# Patient Record
Sex: Male | Born: 1935 | Race: White | Hispanic: No | Marital: Married | Smoking: Former smoker
Health system: Southern US, Community
[De-identification: ages and names within clinical notes are randomized; demographics above are authoritative.]

## PROBLEM LIST (undated history)

## (undated) DIAGNOSIS — I499 Cardiac arrhythmia, unspecified: Secondary | ICD-10-CM

## (undated) DIAGNOSIS — F329 Major depressive disorder, single episode, unspecified: Secondary | ICD-10-CM

## (undated) DIAGNOSIS — E039 Hypothyroidism, unspecified: Secondary | ICD-10-CM

## (undated) DIAGNOSIS — F32A Depression, unspecified: Secondary | ICD-10-CM

## (undated) DIAGNOSIS — M1990 Unspecified osteoarthritis, unspecified site: Secondary | ICD-10-CM

## (undated) DIAGNOSIS — G4730 Sleep apnea, unspecified: Secondary | ICD-10-CM

## (undated) DIAGNOSIS — F419 Anxiety disorder, unspecified: Secondary | ICD-10-CM

## (undated) DIAGNOSIS — R569 Unspecified convulsions: Secondary | ICD-10-CM

## (undated) DIAGNOSIS — N390 Urinary tract infection, site not specified: Secondary | ICD-10-CM

## (undated) DIAGNOSIS — I509 Heart failure, unspecified: Secondary | ICD-10-CM

## (undated) DIAGNOSIS — I2510 Atherosclerotic heart disease of native coronary artery without angina pectoris: Secondary | ICD-10-CM

## (undated) DIAGNOSIS — A499 Bacterial infection, unspecified: Secondary | ICD-10-CM

## (undated) HISTORY — PX: BACK SURGERY: SHX140

## (undated) HISTORY — DX: Urinary tract infection, site not specified: N39.0

## (undated) HISTORY — DX: Cardiac arrhythmia, unspecified: I49.9

## (undated) HISTORY — DX: Major depressive disorder, single episode, unspecified: F32.9

## (undated) HISTORY — DX: Bacterial infection, unspecified: A49.9

## (undated) HISTORY — DX: Depression, unspecified: F32.A

## (undated) HISTORY — DX: Atherosclerotic heart disease of native coronary artery without angina pectoris: I25.10

## (undated) HISTORY — DX: Hypothyroidism, unspecified: E03.9

## (undated) HISTORY — DX: Sleep apnea, unspecified: G47.30

## (undated) HISTORY — DX: Unspecified convulsions: R56.9

## (undated) HISTORY — DX: Anxiety disorder, unspecified: F41.9

## (undated) HISTORY — DX: Unspecified osteoarthritis, unspecified site: M19.90

## (undated) HISTORY — DX: Heart failure, unspecified: I50.9

---

## 1999-04-20 HISTORY — PX: CARDIAC CATHETERIZATION: SHX172

## 1999-04-20 HISTORY — PX: CARDIAC VALVE REPLACEMENT: SHX585

## 1999-05-29 ENCOUNTER — Encounter: Payer: Self-pay | Admitting: Interventional Cardiology

## 1999-05-29 ENCOUNTER — Inpatient Hospital Stay (HOSPITAL_COMMUNITY): Admission: EM | Admit: 1999-05-29 | Discharge: 1999-06-07 | Payer: Self-pay | Admitting: Emergency Medicine

## 1999-05-30 ENCOUNTER — Encounter: Payer: Self-pay | Admitting: Interventional Cardiology

## 1999-06-02 ENCOUNTER — Encounter: Payer: Self-pay | Admitting: Cardiothoracic Surgery

## 1999-06-03 ENCOUNTER — Encounter: Payer: Self-pay | Admitting: Cardiothoracic Surgery

## 1999-06-04 ENCOUNTER — Encounter: Payer: Self-pay | Admitting: Cardiothoracic Surgery

## 1999-07-01 ENCOUNTER — Encounter (HOSPITAL_COMMUNITY): Admission: RE | Admit: 1999-07-01 | Discharge: 1999-09-29 | Payer: Self-pay | Admitting: Interventional Cardiology

## 2002-03-30 ENCOUNTER — Encounter: Admission: RE | Admit: 2002-03-30 | Discharge: 2002-03-30 | Payer: Self-pay | Admitting: Internal Medicine

## 2002-03-30 ENCOUNTER — Encounter: Payer: Self-pay | Admitting: Internal Medicine

## 2002-04-19 HISTORY — PX: JOINT REPLACEMENT: SHX530

## 2002-06-04 ENCOUNTER — Encounter (INDEPENDENT_AMBULATORY_CARE_PROVIDER_SITE_OTHER): Payer: Self-pay

## 2002-06-04 ENCOUNTER — Ambulatory Visit (HOSPITAL_COMMUNITY): Admission: RE | Admit: 2002-06-04 | Discharge: 2002-06-04 | Payer: Self-pay | Admitting: Gastroenterology

## 2003-07-05 ENCOUNTER — Encounter: Admission: RE | Admit: 2003-07-05 | Discharge: 2003-07-05 | Payer: Self-pay | Admitting: Internal Medicine

## 2003-09-27 ENCOUNTER — Encounter: Admission: RE | Admit: 2003-09-27 | Discharge: 2003-09-27 | Payer: Self-pay | Admitting: Neurological Surgery

## 2003-09-30 ENCOUNTER — Encounter: Admission: RE | Admit: 2003-09-30 | Discharge: 2003-09-30 | Payer: Self-pay | Admitting: Neurological Surgery

## 2003-10-18 ENCOUNTER — Encounter: Admission: RE | Admit: 2003-10-18 | Discharge: 2003-10-18 | Payer: Self-pay | Admitting: Neurological Surgery

## 2003-11-26 ENCOUNTER — Encounter: Admission: RE | Admit: 2003-11-26 | Discharge: 2003-11-26 | Payer: Self-pay | Admitting: Neurological Surgery

## 2003-12-12 ENCOUNTER — Ambulatory Visit (HOSPITAL_COMMUNITY): Admission: RE | Admit: 2003-12-12 | Discharge: 2003-12-13 | Payer: Self-pay | Admitting: Neurological Surgery

## 2004-09-03 ENCOUNTER — Encounter: Admission: RE | Admit: 2004-09-03 | Discharge: 2004-09-03 | Payer: Self-pay | Admitting: Neurological Surgery

## 2004-09-09 ENCOUNTER — Ambulatory Visit (HOSPITAL_COMMUNITY): Admission: RE | Admit: 2004-09-09 | Discharge: 2004-09-10 | Payer: Self-pay | Admitting: Neurological Surgery

## 2004-09-11 ENCOUNTER — Emergency Department (HOSPITAL_COMMUNITY): Admission: EM | Admit: 2004-09-11 | Discharge: 2004-09-11 | Payer: Self-pay | Admitting: Emergency Medicine

## 2004-09-21 ENCOUNTER — Ambulatory Visit (HOSPITAL_COMMUNITY): Admission: RE | Admit: 2004-09-21 | Discharge: 2004-09-22 | Payer: Self-pay | Admitting: Neurological Surgery

## 2004-12-23 ENCOUNTER — Inpatient Hospital Stay (HOSPITAL_COMMUNITY): Admission: RE | Admit: 2004-12-23 | Discharge: 2004-12-25 | Payer: Self-pay | Admitting: Neurological Surgery

## 2005-01-25 ENCOUNTER — Encounter: Admission: RE | Admit: 2005-01-25 | Discharge: 2005-01-25 | Payer: Self-pay | Admitting: Neurological Surgery

## 2005-10-04 ENCOUNTER — Encounter: Admission: RE | Admit: 2005-10-04 | Discharge: 2005-10-04 | Payer: Self-pay | Admitting: Neurological Surgery

## 2006-02-02 ENCOUNTER — Inpatient Hospital Stay (HOSPITAL_COMMUNITY): Admission: RE | Admit: 2006-02-02 | Discharge: 2006-02-05 | Payer: Self-pay | Admitting: Orthopedic Surgery

## 2006-03-02 ENCOUNTER — Encounter: Admission: RE | Admit: 2006-03-02 | Discharge: 2006-03-29 | Payer: Self-pay | Admitting: Orthopedic Surgery

## 2006-03-15 ENCOUNTER — Ambulatory Visit (HOSPITAL_COMMUNITY): Admission: RE | Admit: 2006-03-15 | Discharge: 2006-03-16 | Payer: Self-pay | Admitting: Orthopedic Surgery

## 2006-03-30 ENCOUNTER — Encounter: Admission: RE | Admit: 2006-03-30 | Discharge: 2006-04-18 | Payer: Self-pay | Admitting: Orthopedic Surgery

## 2006-04-19 ENCOUNTER — Encounter: Admission: RE | Admit: 2006-04-19 | Discharge: 2006-05-25 | Payer: Self-pay | Admitting: Orthopedic Surgery

## 2006-06-27 ENCOUNTER — Encounter: Admission: RE | Admit: 2006-06-27 | Discharge: 2006-06-27 | Payer: Self-pay | Admitting: Internal Medicine

## 2006-06-28 ENCOUNTER — Inpatient Hospital Stay (HOSPITAL_COMMUNITY): Admission: RE | Admit: 2006-06-28 | Discharge: 2006-07-01 | Payer: Self-pay | Admitting: Urology

## 2006-12-12 ENCOUNTER — Encounter: Admission: RE | Admit: 2006-12-12 | Discharge: 2006-12-12 | Payer: Self-pay | Admitting: Anesthesiology

## 2007-03-31 ENCOUNTER — Encounter: Admission: RE | Admit: 2007-03-31 | Discharge: 2007-03-31 | Payer: Self-pay | Admitting: Neurological Surgery

## 2007-05-12 ENCOUNTER — Encounter: Admission: RE | Admit: 2007-05-12 | Discharge: 2007-05-12 | Payer: Self-pay | Admitting: Neurological Surgery

## 2007-05-15 ENCOUNTER — Encounter: Admission: RE | Admit: 2007-05-15 | Discharge: 2007-05-15 | Payer: Self-pay | Admitting: Neurological Surgery

## 2007-06-07 ENCOUNTER — Inpatient Hospital Stay (HOSPITAL_COMMUNITY): Admission: RE | Admit: 2007-06-07 | Discharge: 2007-06-09 | Payer: Self-pay | Admitting: Neurological Surgery

## 2007-08-15 ENCOUNTER — Encounter: Admission: RE | Admit: 2007-08-15 | Discharge: 2007-08-15 | Payer: Self-pay | Admitting: Neurological Surgery

## 2007-12-11 ENCOUNTER — Encounter: Admission: RE | Admit: 2007-12-11 | Discharge: 2007-12-11 | Payer: Self-pay | Admitting: Neurological Surgery

## 2008-03-16 ENCOUNTER — Emergency Department (HOSPITAL_COMMUNITY): Admission: EM | Admit: 2008-03-16 | Discharge: 2008-03-16 | Payer: Self-pay | Admitting: Emergency Medicine

## 2008-03-21 ENCOUNTER — Encounter: Admission: RE | Admit: 2008-03-21 | Discharge: 2008-03-21 | Payer: Self-pay | Admitting: Internal Medicine

## 2008-03-26 ENCOUNTER — Encounter: Admission: RE | Admit: 2008-03-26 | Discharge: 2008-03-26 | Payer: Self-pay | Admitting: Neurological Surgery

## 2008-04-01 ENCOUNTER — Encounter: Admission: RE | Admit: 2008-04-01 | Discharge: 2008-04-01 | Payer: Self-pay | Admitting: Neurological Surgery

## 2008-04-29 ENCOUNTER — Encounter: Admission: RE | Admit: 2008-04-29 | Discharge: 2008-04-29 | Payer: Self-pay | Admitting: Neurological Surgery

## 2008-05-27 ENCOUNTER — Encounter: Admission: RE | Admit: 2008-05-27 | Discharge: 2008-05-27 | Payer: Self-pay | Admitting: Neurological Surgery

## 2008-06-07 ENCOUNTER — Emergency Department (HOSPITAL_COMMUNITY): Admission: EM | Admit: 2008-06-07 | Discharge: 2008-06-07 | Payer: Self-pay | Admitting: Emergency Medicine

## 2008-06-28 ENCOUNTER — Encounter: Admission: RE | Admit: 2008-06-28 | Discharge: 2008-06-28 | Payer: Self-pay | Admitting: Neurology

## 2009-04-19 DIAGNOSIS — G4730 Sleep apnea, unspecified: Secondary | ICD-10-CM

## 2009-04-19 HISTORY — DX: Sleep apnea, unspecified: G47.30

## 2009-06-16 ENCOUNTER — Encounter: Admission: RE | Admit: 2009-06-16 | Discharge: 2009-06-16 | Payer: Self-pay | Admitting: Neurological Surgery

## 2009-06-24 ENCOUNTER — Inpatient Hospital Stay (HOSPITAL_COMMUNITY): Admission: EM | Admit: 2009-06-24 | Discharge: 2009-06-27 | Payer: Self-pay | Admitting: Emergency Medicine

## 2009-07-07 ENCOUNTER — Encounter: Admission: RE | Admit: 2009-07-07 | Discharge: 2009-07-07 | Payer: Self-pay | Admitting: Neurological Surgery

## 2009-08-18 ENCOUNTER — Encounter: Admission: RE | Admit: 2009-08-18 | Discharge: 2009-08-18 | Payer: Self-pay | Admitting: Neurological Surgery

## 2009-11-24 ENCOUNTER — Encounter: Admission: RE | Admit: 2009-11-24 | Discharge: 2009-11-24 | Payer: Self-pay | Admitting: Neurological Surgery

## 2010-02-23 ENCOUNTER — Encounter: Admission: RE | Admit: 2010-02-23 | Discharge: 2010-02-23 | Payer: Self-pay | Admitting: Neurological Surgery

## 2010-05-10 ENCOUNTER — Encounter: Payer: Self-pay | Admitting: Neurological Surgery

## 2010-05-10 ENCOUNTER — Encounter: Payer: Self-pay | Admitting: Internal Medicine

## 2010-06-08 ENCOUNTER — Other Ambulatory Visit: Payer: Self-pay | Admitting: Orthopedic Surgery

## 2010-06-08 ENCOUNTER — Encounter (HOSPITAL_COMMUNITY): Payer: Medicare Other | Attending: Orthopedic Surgery

## 2010-06-08 DIAGNOSIS — Z01812 Encounter for preprocedural laboratory examination: Secondary | ICD-10-CM | POA: Insufficient documentation

## 2010-06-08 LAB — URINALYSIS, ROUTINE W REFLEX MICROSCOPIC
Bilirubin Urine: NEGATIVE
Ketones, ur: NEGATIVE mg/dL
Nitrite: NEGATIVE
Protein, ur: NEGATIVE mg/dL
Urine Glucose, Fasting: NEGATIVE mg/dL

## 2010-06-08 LAB — CBC
MCHC: 33.8 g/dL (ref 30.0–36.0)
MCV: 93.1 fL (ref 78.0–100.0)
Platelets: 182 10*3/uL (ref 150–400)
RDW: 13.7 % (ref 11.5–15.5)
WBC: 7.6 10*3/uL (ref 4.0–10.5)

## 2010-06-08 LAB — DIFFERENTIAL
Basophils Absolute: 0 10*3/uL (ref 0.0–0.1)
Basophils Relative: 0 % (ref 0–1)
Eosinophils Absolute: 0.4 10*3/uL (ref 0.0–0.7)
Eosinophils Absolute: 0.4 10*3/uL (ref 0.0–0.7)
Eosinophils Relative: 5 % (ref 0–5)
Eosinophils Relative: 5 % (ref 0–5)
Lymphocytes Relative: 25 % (ref 12–46)
Lymphs Abs: 1.9 10*3/uL (ref 0.7–4.0)
Monocytes Absolute: 0.5 10*3/uL (ref 0.1–1.0)
Monocytes Absolute: 0.5 10*3/uL (ref 0.1–1.0)

## 2010-06-08 LAB — BASIC METABOLIC PANEL
BUN: 24 mg/dL — ABNORMAL HIGH (ref 6–23)
CO2: 29 mEq/L (ref 19–32)
Calcium: 8.9 mg/dL (ref 8.4–10.5)
Chloride: 105 mEq/L (ref 96–112)
Creatinine, Ser: 1.39 mg/dL (ref 0.4–1.5)
GFR calc Af Amer: 59 mL/min — ABNORMAL LOW (ref >60)
Glucose, Bld: 96 mg/dL (ref 70–99)

## 2010-06-08 LAB — PROTIME-INR: Prothrombin Time: 15.6 seconds — ABNORMAL HIGH (ref 11.6–15.2)

## 2010-06-09 ENCOUNTER — Inpatient Hospital Stay (HOSPITAL_COMMUNITY)
Admission: RE | Admit: 2010-06-09 | Discharge: 2010-06-17 | DRG: 469 | Disposition: A | Discharge status: Home Health | Payer: Medicare Other | Source: Ambulatory Visit | Attending: Orthopedic Surgery | Admitting: Orthopedic Surgery

## 2010-06-09 DIAGNOSIS — N400 Enlarged prostate without lower urinary tract symptoms: Secondary | ICD-10-CM | POA: Diagnosis present

## 2010-06-09 DIAGNOSIS — Z954 Presence of other heart-valve replacement: Secondary | ICD-10-CM

## 2010-06-09 DIAGNOSIS — I4891 Unspecified atrial fibrillation: Secondary | ICD-10-CM | POA: Diagnosis present

## 2010-06-09 DIAGNOSIS — M1710 Unilateral primary osteoarthritis, unspecified knee: Principal | ICD-10-CM | POA: Diagnosis present

## 2010-06-09 DIAGNOSIS — F411 Generalized anxiety disorder: Secondary | ICD-10-CM | POA: Diagnosis present

## 2010-06-09 DIAGNOSIS — I5033 Acute on chronic diastolic (congestive) heart failure: Secondary | ICD-10-CM | POA: Diagnosis not present

## 2010-06-09 DIAGNOSIS — N390 Urinary tract infection, site not specified: Secondary | ICD-10-CM | POA: Diagnosis not present

## 2010-06-09 DIAGNOSIS — I509 Heart failure, unspecified: Secondary | ICD-10-CM | POA: Diagnosis not present

## 2010-06-09 DIAGNOSIS — G40909 Epilepsy, unspecified, not intractable, without status epilepticus: Secondary | ICD-10-CM | POA: Diagnosis present

## 2010-06-09 DIAGNOSIS — E785 Hyperlipidemia, unspecified: Secondary | ICD-10-CM | POA: Diagnosis present

## 2010-06-09 LAB — TYPE AND SCREEN

## 2010-06-10 LAB — CBC
HCT: 38 % — ABNORMAL LOW (ref 39.0–52.0)
MCV: 94.5 fL (ref 78.0–100.0)
RBC: 4.02 MIL/uL — ABNORMAL LOW (ref 4.22–5.81)
RDW: 13.9 % (ref 11.5–15.5)
WBC: 11.4 10*3/uL — ABNORMAL HIGH (ref 4.0–10.5)

## 2010-06-10 LAB — BASIC METABOLIC PANEL
BUN: 17 mg/dL (ref 6–23)
Chloride: 107 mEq/L (ref 96–112)
GFR calc non Af Amer: 52 mL/min — ABNORMAL LOW (ref >60)
Glucose, Bld: 97 mg/dL (ref 70–99)
Potassium: 4.2 mEq/L (ref 3.5–5.1)
Sodium: 139 mEq/L (ref 135–145)

## 2010-06-10 LAB — URINALYSIS, MICROSCOPIC ONLY
Bilirubin Urine: NEGATIVE
Ketones, ur: NEGATIVE mg/dL
Nitrite: NEGATIVE
Specific Gravity, Urine: 1.008 (ref 1.005–1.030)
Urine Glucose, Fasting: NEGATIVE mg/dL
pH: 5.5 (ref 5.0–8.0)

## 2010-06-10 NOTE — Consult Note (Signed)
NAMEVERN, GUERETTE                 ACCOUNT NO.:  [id number]  MEDICAL RECORD NO.:  [id number]           PATIENT TYPE:  I  LOCATION:  1404                         FACILITY:  [facility]  PHYSICIAN:  Jake Bathe, MD      DATE OF BIRTH:  April 07, 1928  DATE OF CONSULTATION:  06/10/2010 DATE OF DISCHARGE:                                CONSULTATION   CARDIOLOGIST:  Dr. Verdis Prime.  PRIMARY PHYSICIAN:  Dr. Theressa Millard.  REASON FOR CONSULTATION:  Evaluation of atrial fibrillation and anticoagulation in the perioperative course.  HISTORY OF PRESENT ILLNESS:  Mr. Coopersmith is an 77 year old patient of Dr. Katrinka Blazing with a right total knee replacement performed by Dr. Charlann Boxer with chronic atrial fibrillation who prior to surgery was placed on amiodarone 200 mg twice a day as well as metoprolol 50 mg once a day to improve his persistent atrial fibrillation and heart rate.  Surgery went fine, he tolerated the procedure very well and currently on telemetry his heart rates have been in the 80s to 90s mostly, occasionally bumping into the low 100s.  He is asymptomatic from a cardiovascular perspective and doing quite well postoperatively.  Anticoagulation will be needed in postoperative care and this is one of the reasons for this consultation.  He has had intolerances to Coumadin in the past.  PAST MEDICAL HISTORY: 1. Bioprosthetic aortic valve in 2001 for aortic stenosis. 2. Paroxysmal atrial fibrillation, suppressed with amiodarone therapy. 3. BPH. 4. Hyperlipidemia. 5. Spinal stenosis. 6. History of seizure disorder. 7. History of nephrolithiasis. 8. History of hemarthrosis, on Coumadin.  ALLERGIES:  COUMADIN, OXYCODONE and PENICILLIN.  CURRENT MEDICATIONS: 1. Niacin 5 mg twice a day. 2. Metoprolol XL succinate 50 mg every morning. 3. Amiodarone 200 mg twice a day. 4. Finasteride 5 mg every morning. 5. He is also on Dilantin.  Other medications reviewed.  Please see med reconciliation for  full details.  PAST SURGICAL HISTORY:  Prior total knee replacement in 2007, lumbar fusion, and lumbar laminectomy.  SOCIAL HISTORY:  Former smoker, quit in 1985.  Drinks 1 or 2 alcoholic drinks per week.  He is retired, sometimes works at [employer].  REVIEW OF SYSTEMS:  Having knee pain currently postoperatively.  Denies any syncope, chest pain, shortness of breath, bleeding.  Unless specified above, all other 12 review of systems negative.  PHYSICAL EXAMINATION:  GENERAL:  Currently in discomfort from his knee but in no acute distress, alert and oriented x3. VITAL SIGNS:  Temperature 98.1, pulse 77, respirations 16, blood pressure 136/787, sating 92% on 2 L HEENT:  Eyes, well-perfused conjunctivae.  EOMI.  No scleral icterus. CARDIOVASCULAR:  Irregularly irregular rhythm with regular rate.  A 2/6 crescendo-decrescendo murmur at right upper sternal border. LUNGS:  Clear to auscultation bilaterally.  No wheezes.  No rales. Normal respiratory effort. ABDOMEN:  Soft, nontender, normoactive bowel sounds.  No rebound, no guarding. GU:  Deferred. RECTAL:  Deferred. NEURO:  Nonfocal.  No tremors are noted. PSYCH:  Normal affect but in pain.  LABORATORY DATA:  Telemetry reviewed as above, atrial fibrillation, persistent but fairly well rate controlled.  Sodium 139, potassium 4.2, BUN 17, creatinine 1.32.  GFR 52.  White count 11.4, hemoglobin 12.6, hematocrit 38, platelet count 186. Telemetry reviewed as above.  Prior EKG reviewed.  ASSESSMENT/PLAN:  An 77 year old male status post total knee replacement with paroxysmal atrial fibrillation, currently in atrial fibrillation with need of chronic anticoagulation.  Atrial fibrillation - currently well rate controlled on amiodarone 200 twice a day as well as metoprolol 50 mg once a day.  ASSESSMENT AND PLAN: 1. His atrial fibrillation is currently well rate controlled on     current medications as described above.  He  will require full     anticoagulation and has had issues with Coumadin in the past as     described above.  In the chart already has been notated that he     will be starting Pradaxa as an outpatient.  However, Xarelto or     rivaroxaban which is factor X inhibitor has already been started     for DVT prophylaxis in the setting of his knee replacement at 10 mg     once a day.  My suggestion is to increase the Xarelto or     rivaroxaban to 20 mg once a day for full anticoagulation.  This     would be given in lieu of Pradaxa twice a day.  This would help     protect him against stroke in the setting of atrial fibrillation.     Please increase rivaroxaban or Xarelto to 20 mg once a day and give     to him as outpatient.  Creatinine clearance should be able to     tolerate this dosing regimen.  Continue with amiodarone as well as     metoprolol. 2. Right knee pain - per primary team status post knee replacement. 3. Chronic anticoagulation as above. 4. Hyperlipidemia.  Continue with niacin. 5. Aortic valve replacement, bioprosthetic - no changes to current     regimen.  I will notify Dr. Verdis Prime.     Jake Bathe, MD     MCS/MEDQ  D:  06/10/2010  T:  06/10/2010  Job:  [id number]  Electronically Signed by Donato Schultz MD on 06/10/2010 24:40:10 PM

## 2010-06-11 LAB — BASIC METABOLIC PANEL
Calcium: 7.9 mg/dL — ABNORMAL LOW (ref 8.4–10.5)
GFR calc Af Amer: 47 mL/min — ABNORMAL LOW (ref >60)
GFR calc non Af Amer: 39 mL/min — ABNORMAL LOW (ref >60)
Potassium: 4.7 mEq/L (ref 3.5–5.1)
Sodium: 135 mEq/L (ref 135–145)

## 2010-06-11 LAB — CBC
MCHC: 33 g/dL (ref 30.0–36.0)
RDW: 13.4 % (ref 11.5–15.5)
WBC: 15.3 10*3/uL — ABNORMAL HIGH (ref 4.0–10.5)

## 2010-06-12 LAB — URINE CULTURE
Colony Count: NO GROWTH
Culture  Setup Time: 201202230514
Special Requests: POSITIVE

## 2010-06-12 LAB — BASIC METABOLIC PANEL
BUN: 17 mg/dL (ref 6–23)
CO2: 23 mEq/L (ref 19–32)
Chloride: 108 mEq/L (ref 96–112)
Creatinine, Ser: 1.98 mg/dL — ABNORMAL HIGH (ref 0.4–1.5)
Glucose, Bld: 146 mg/dL — ABNORMAL HIGH (ref 70–99)

## 2010-06-12 NOTE — Op Note (Signed)
NAMETOUSSAINT, [name]                 ACCOUNT NO.:  [id number]  MEDICAL RECORD NO.:  [id number]           PATIENT TYPE:  I  LOCATION:  1404                         FACILITY:  [facility]  PHYSICIAN:  Madlyn Frankel. Charlann Boxer, M.D.  DATE OF BIRTH:  [birth year]  DATE OF PROCEDURE:  06/09/2010 DATE OF DISCHARGE:                              OPERATIVE REPORT   PREOPERATIVE DIAGNOSIS:  Right knee osteoarthritis.  POSTOPERATIVE DIAGNOSIS:  Right knee osteoarthritis.  PROCEDURE:  Right total knee replacement.  COMPONENTS USED:  DePuy rotating platform posterior stabilized knee system with size 4 narrow femur, 4 tibia, 12.5 insert and 41 patellar button.  SURGEON:  Madlyn Frankel. Charlann Boxer, M.D.  ASSISTANT:  Nelia Shi. Webb Silversmith, RN  ANESTHESIA:  Preoperative regional femoral nerve block plus general anesthetic.  SPECIMENS:  None.  COMPLICATIONS:  None.  DRAINS:  One Hemovac.  TOURNIQUET TIME:  33 minutes at 250 mmHg.  BLOOD LOSS:  About 100 cc.  INDICATIONS FOR PROCEDURE:  [name] is an 77 year old gentleman with a history of bilateral knee arthritis.  He has had a history of left total knee replacement he had done well with.  He was referred for surgical consultation.  He had an advanced right knee osteoarthritis, was failing respond to conservative measures and he wished to proceed with arthroplasty.  Risks and benefits reviewed from his previous course.  He was ready to proceed.  Consent was obtained for the benefit of pain relief.  PROCEDURE IN DETAIL:  The patient was brought to operative theater. Once adequate anesthesia was established and preoperative antibiotics administered, Ancef, he was positioned supine with the right thigh tourniquet placed.  The right lower extremity was then prepped and draped in sterile fashion with the right leg placed in the Mayo leg holder.  Time-out was performed identifying the patient, planned procedure and extremity.  Leg was exsanguinated.  Tourniquet  elevated to 250 mmHg.  Midline incision was made followed by median parapatellar arthrotomy.  Following initial exposure, attention was first directed to patella.  Precut measurement was noted to be about 25 mm.  I resected down to about 14 mm and used a 41 patellar button to cover the cut surface as well as to restore height.  Lug holes were drilled and a metal shim was placed to protect the patella from retractors and saw blades.  Attention was now directed to the femur.  The femoral canal was opened with a drill, irrigated to try to prevent fat emboli.  An intramedullary rod was passed and at 5 degrees of valgus, 10 mm of bone resected off the distal femur.  The tibia was then subluxated anteriorly.  The extramedullary guide was utilized and lined up parallel to the tibial crest.  A proximal tibial cut was made based off the lateral tibial plateau at 10 mm.  Extension gap was confirmed to be stable with medial and lateral collateral ligaments.  I then confirmed that the cut was perpendicular to the coronal plane. Once this was done, I sized the femur to be a size 4, chose to use a 4 narrow component based on the medial  and lateral dimension.  The size 4 rotation block was then pinned into position, anterior reference based off the proximal C-clamp.  The 4 in 1 cutting block was pinned in position.  Anterior, posterior and chamfer cuts were then made without difficulty or notching.  Final box cut was made off the lateral aspect of distal femur.  Tibia was then subluxated again, size 4 tibial tray fit best.  It was pinned into position, drilled and keel punched.  Trial reduction was carried out with a 4 femur, 4 tibia and initially 12-mm insert.  The knee came to full extension with stable medial and lateral collateral ligaments.  The patella tracked through the trochlea without application of pressure.  The trial components were then removed.  The final components were opened  including the polyethylene insert.  Once I irrigated the knee with normal saline solution and pulse lavaged, cement was mixed and the final component then cemented onto clean and dried cut surfaces of bone.  At this point, the extruded cement was removed.  Once the cement had fully cured and excessive cement was removed throughout the knee, the final 12.5 insert to match the 4 femur was chosen.  The medium Hemovac drain was placed deep.  Tourniquet had been let down after 33 minutes.  There was some synovial oozing but nothing significant.  I then reapproximated extensor mechanism using #1 Vicryl with the knee in flexion.  The remaining of the wound was closed with 2- 0 Vicryl and running 4-0 Monocryl.  The knee was cleaned, dried and dressed sterilely utilizing Dermabond and Aquacel dressing.  The drain site was dressed separately.  The knee was then wrapped in an Ace wrap. He was then extubated and brought to recovery room in stable condition tolerating the procedure well.     Madlyn Frankel Charlann Boxer, M.D.     MDO/MEDQ  D:  06/09/2010  T:  06/10/2010  Job:  [id number]  Electronically Signed by Durene Romans M.D. on 06/12/2010 07:05:00 AM

## 2010-06-12 NOTE — H&P (Signed)
[name], [name]                 ACCOUNT NO.:  [id number]  MEDICAL RECORD NO.:  [id number]           PATIENT TYPE:  LOCATION:                                 FACILITY:  PHYSICIAN:  Madlyn Frankel. Charlann Boxer, M.D.  DATE OF BIRTH:  Dec 16, 1927  DATE OF ADMISSION: DATE OF DISCHARGE:                             HISTORY & PHYSICAL   ADMITTING DIAGNOSIS:  Right knee osteoarthritis.  BRIEF HISTORY:  This is a patient seen by Dr. Darrelyn Hillock for sometime for right knee pain.  He has failed conservative treatment, was referred to Dr. Charlann Boxer for consideration of right knee arthroplasty and has decided to proceed with such on June 09, 2010.  PAST MEDICAL HISTORY:  Significant for: 1. Seizures of some type, the last one was March 2011. 2. He has sleep apnea. 3. High cholesterol. 4. Generalized osteoarthritis. 5. He just had some type of skin cancer removed from his right temple. 6. He has frequent urinary tract infections and p.m. frequency. 7. Low back pain with a history of fusions. 8. Generalized fatigue. 9. Balance problems. 10.The patient has also got a history of a heart valve replacement. 11.In 2007, aortic valve replacement.  PAST SURGICAL HISTORY:  Surgical history is that of the aortic replacement in 2007.  He has had 6 back surgeries, a left knee surgery and other small procedures, he does not have listed.  SOCIAL HISTORY:  The patient is married.  He is a retired Music therapist.  He has a past history of tobacco.  No history of alcohol or street drugs abuse.  He has 4 children.  DISPOSITION:  The disposition plan is to go home with home health physical therapy.  MEDICATIONS:  Current medication list is: 1. Dilantin 100 mg 2 times a day. 2. Requip 0.5 every 8 hours. 3. Simvastatin 5 mg daily. 4. Amiodarone 300 mg daily. 5. Hydrocodone, currently he is taking as needed. 6. Alendronate  70 mg daily. 7. Folic acid daily. 8. Flector patch daily. 9. Niacin. 10.Vitamin  C. 11.Calcium. 12.Magnesium. 13.Vitamin D3. 14.Regular aspirin. 15.Colace. 16.Citrucel. 17.Advil as needed. 18.Osteo Bi-Flex daily.  ALLERGIES:  He states that OXYCODONE gives him delusions.  He cannot take PENICILLIN.  REVIEW OF SYSTEMS:  Notable for those difficulties described in the history of present illness, past medical history.  However, his review of system sheet is otherwise unremarkable.  PHYSICAL EXAMINATION:  VITAL SIGNS:  Patient is 5 feet 6 inches.  He is 178 pounds. HEENT:  Shows him to be normocephalic.  He does have tinnitus from time to time with poor balance.  He has generalized fatigue syndrome. NECK:  Unremarkable. CHEST:  Shows clear to auscultation bilaterally.  He has sleep apnea. HEART:  Has S1, S2.  No murmurs, rubs, gallops. ABDOMEN:  Soft, nondistended. GENITOURINARY/GASTROINTESTINAL:  Otherwise unremarkable except for the frequent UTIs and p.m. frequency. EXTREMITIES:  Exam shows osteoarthritis with low back pains. DERMATOLOGICALLY:  Intact.  He is healing, just had staples removed today from his right temple for skin cancer. NEUROLOGICALLY:  Intact.  LABORATORY DATA:  Labs, EKG and chest x-ray are pending through [facility].  IMPRESSION:  Right knee osteoarthritis.  PLAN:  He will be admitted on June 09, 2010, for a total knee arthroplasty with Dr. Charlann Boxer.  His discharge meds including Robaxin, Xarelto, iron, MiraLax, and Colace were given to him today.  His pain medicines will be given to him at discharge.     William L. Webb Silversmith, RN   ______________________________ Madlyn Frankel Charlann Boxer, M.D.    RLW/MEDQ  D:  06/01/2010  T:  06/01/2010  Job:  [id number]  Electronically Signed by Lauree Chandler NP-C on 06/11/2010 01:22:54 PM Electronically Signed by Durene Romans M.D. on 06/12/2010 07:04:55 AM

## 2010-06-13 DIAGNOSIS — I4891 Unspecified atrial fibrillation: Secondary | ICD-10-CM

## 2010-06-13 LAB — BASIC METABOLIC PANEL
Chloride: 110 mEq/L (ref 96–112)
GFR calc non Af Amer: 43 mL/min — ABNORMAL LOW (ref >60)
Glucose, Bld: 141 mg/dL — ABNORMAL HIGH (ref 70–99)
Potassium: 5.3 mEq/L — ABNORMAL HIGH (ref 3.5–5.1)
Sodium: 139 mEq/L (ref 135–145)

## 2010-06-13 LAB — URINALYSIS, ROUTINE W REFLEX MICROSCOPIC
Bilirubin Urine: NEGATIVE
Nitrite: NEGATIVE
Specific Gravity, Urine: 1.019 (ref 1.005–1.030)
Urine Glucose, Fasting: NEGATIVE mg/dL
pH: 5.5 (ref 5.0–8.0)

## 2010-06-13 LAB — HEMOGLOBIN AND HEMATOCRIT, BLOOD: Hemoglobin: 9.9 g/dL — ABNORMAL LOW (ref 13.0–17.0)

## 2010-06-13 LAB — URINE MICROSCOPIC-ADD ON

## 2010-06-14 ENCOUNTER — Inpatient Hospital Stay (HOSPITAL_COMMUNITY): Payer: Medicare Other

## 2010-06-14 DIAGNOSIS — R0602 Shortness of breath: Secondary | ICD-10-CM

## 2010-06-14 LAB — BASIC METABOLIC PANEL
Calcium: 7.6 mg/dL — ABNORMAL LOW (ref 8.4–10.5)
Chloride: 109 mEq/L (ref 96–112)
Creatinine, Ser: 1.27 mg/dL (ref 0.4–1.5)
GFR calc Af Amer: >60 mL/min (ref >60)

## 2010-06-14 LAB — URINE CULTURE
Culture  Setup Time: 201202252023
Special Requests: POSITIVE

## 2010-06-14 LAB — TROPONIN I
Troponin I: 0.04 ng/mL (ref 0.00–0.06)
Troponin I: 0.05 ng/mL (ref 0.00–0.06)

## 2010-06-14 LAB — CK TOTAL AND CKMB (NOT AT ARMC)
CK, MB: 1.8 ng/mL (ref 0.3–4.0)
Relative Index: INVALID (ref 0.0–2.5)
Total CK: 61 U/L (ref 7–232)

## 2010-06-14 LAB — HEMOCCULT GUIAC POC 1CARD (OFFICE): Fecal Occult Bld: NEGATIVE

## 2010-06-14 LAB — BRAIN NATRIURETIC PEPTIDE: Pro B Natriuretic peptide (BNP): 402 pg/mL — ABNORMAL HIGH (ref 0.0–100.0)

## 2010-06-15 LAB — BASIC METABOLIC PANEL
CO2: 23 mEq/L (ref 19–32)
Calcium: 7.7 mg/dL — ABNORMAL LOW (ref 8.4–10.5)
Creatinine, Ser: 1.44 mg/dL (ref 0.4–1.5)
GFR calc non Af Amer: 47 mL/min — ABNORMAL LOW (ref >60)
Glucose, Bld: 128 mg/dL — ABNORMAL HIGH (ref 70–99)
Sodium: 141 mEq/L (ref 135–145)

## 2010-06-16 LAB — BASIC METABOLIC PANEL
BUN: 8 mg/dL (ref 6–23)
CO2: 25 mEq/L (ref 19–32)
Calcium: 7.6 mg/dL — ABNORMAL LOW (ref 8.4–10.5)
Creatinine, Ser: 1.38 mg/dL (ref 0.4–1.5)
GFR calc non Af Amer: 50 mL/min — ABNORMAL LOW (ref >60)
Glucose, Bld: 126 mg/dL — ABNORMAL HIGH (ref 70–99)

## 2010-06-16 LAB — URINALYSIS, ROUTINE W REFLEX MICROSCOPIC
Bilirubin Urine: NEGATIVE
Ketones, ur: NEGATIVE mg/dL
Nitrite: POSITIVE — AB
Protein, ur: 100 mg/dL — AB
Urobilinogen, UA: 0.2 mg/dL (ref 0.0–1.0)

## 2010-06-16 LAB — URINE MICROSCOPIC-ADD ON

## 2010-06-17 LAB — BASIC METABOLIC PANEL
CO2: 26 mEq/L (ref 19–32)
Calcium: 8 mg/dL — ABNORMAL LOW (ref 8.4–10.5)
GFR calc Af Amer: >60 mL/min (ref >60)
Potassium: 3.9 mEq/L (ref 3.5–5.1)
Sodium: 139 mEq/L (ref 135–145)

## 2010-06-18 NOTE — Discharge Summary (Signed)
NAMEBRENDEN, [name]                 ACCOUNT NO.:  [id number]  MEDICAL RECORD NO.:  [id number]           PATIENT TYPE:  I  LOCATION:  1430                         FACILITY:  [facility]  PHYSICIAN:  Madlyn Frankel. Charlann Boxer, M.D.  DATE OF BIRTH:  Apr 13, 1928  DATE OF ADMISSION:  06/09/2010 DATE OF DISCHARGE:  06/17/2010                              DISCHARGE SUMMARY   ADMITTING DIAGNOSIS:  Right knee osteoarthritis.  DISCHARGE DIAGNOSES: 1. Right knee osteoarthritis, status post right total knee replacement     on June 09, 2010. 2. Atrial fibrillation. 3. Congestive heart failure. 4. History of benign prostatic hypertrophy. 5. Hyperlipidemia. 6. Anxiety. 7. History of seizure disorder. 8. History of nephrolithiasis. 9. History of bioprosthetic aortic valve for stenosis. 10.Urinary tract infection.  BRIEF HISTORY:  [name] is an 77 year old gentleman who was seen and evaluated in the office for right knee osteoarthritis with significant reduction in his quality of life.  He had previous left total knee replacement.  Once reviewing issues with his medical physician, he was ready to proceed with this and to try and improve his overall quality of life.  Risks and benefits have been discussed in the office.  Surgical plan was set.  HOSPITAL COURSE:  The patient was admitted for same-day surgery on June 09, 2010.  He underwent a right total knee replacement that was uncomplicated.  He was in atrial fibrillation when he presented to the hospital, currently on amiodarone and metoprolol.  Postoperatively, his recovery room stay was uneventful.  He was transferred to the orthopedic ward.  On postop day #2, he was seen and evaluated by Cardiology through Dr. Garnette Scheuermann, his primary cardiologist.  At the time, there was discussion regarding his anticoagulation.  The ultimate decision was to place him on Pradaxa versus Coumadin which the patient had denied in the past. Based on his current  use of phenytoin, he was not a candidate to receive Xarelto.  His postoperative course from hemodynamic standpoint remained stable. On postop day #2, his labs had stabilized with stable electrolytes and hematocrit remained stable.  His wound remained dry.  His Hemovac drain was discontinued for 24 hours after surgery.  His hospital course at this point was thus complicated by some anxiety issues as well as continued efforts to maintain hemodynamic standpoint from his underlying atrial fibrillation.  He did not have any bouts of chest pain that was significant for an acute myocardial event.  He was transferred to and from the orthopedic ward to monitoring.  There was some concern about shortness of breath but this was felt to be due to fluid overload and perhaps some diastolic failure for which Cardiology was on board and following.  On postop day #7 on June 16, 2010, he underwent a cardioversion due to his persistent atrial fibrillation and his fluid overload and balance issues related perhaps to diastolic failure.  On postprocedure day #8 on June 17, 2010, he remained in sinus rhythm and was seen and evaluated by Dr. Garnette Scheuermann and felt safe to be discharged to home.  While in the hospital, he was seen and evaluated  by Physical Therapy to work on as much motion in his knee as possible due to his limited mobility and anxiety early on.  He was placed on the CPM machine while he was in the hospital.  DISCHARGE INSTRUCTIONS:  The patient is to be discharged to home today with home health physical therapy to maximize his range of motion, strength, and gait.  He may shower immediately as he has a water sealed dressing.  He will need to return to see Dr. Garnette Scheuermann within a week and myself within a week.  He can contact our office at [facility] for that appointment.  He is otherwise to keep his wound as dry as possible.  DISCHARGE  MEDICATIONS: 1. Xanax 0.5 mg every 8 hours as needed for some anxiety. 2. Ciprofloxacin 500 mg p.o. b.i.d. for 5 days. 3. Pradaxa 150 mg p.o. q.12h. 4. Iron sulfate which he received a prescription for earlier 325 mg     b.i.d. 3 times a day for 2 weeks and then off. 5. Norco 7.5/325 one to two tablets every 4-6 hours as needed for     pain. 6. Robaxin 500 mg p.o. q.6h. as needed for muscle spasm pain. 7. MiraLax 17 g daily as needed for constipation. 8. Colace 100 mg p.o. b.i.d. as needed for constipation while on pain     medicines, decrease with abdominal cramping or loose stools. 9. Alendronate 70 mg weekly. 10.Amiodarone 200 mg p.o. b.i.d. 11.Metoprolol 50 mg q.a.m. 12.Calcium over-the-counter. 13.Phenytoin 100 mg 2 capsules b.i.d. 14.Finasteride 5 mg p.o. q.a.m. 15.Flector patches and glucosamine can be used as needed and be     discontinued as his knee arthritis is at this point treated. 16.Magnesium oxide 400 mg over-the-counter daily. 17.Niacin 500 mg 2 tablets b.i.d. 18.Ropinirole 0.5 mg 1 q.8h. for muscle cramps. 19.Vitamin D3 is 1000 units daily. 20.Vitamin C 500 mg daily. 21.Advil PM as needed.  If there are orthopedic questions, that can be addressed through our office, and any cardiology questions, he will of course contact Dr. Erskine Emery Smith's office.     Madlyn Frankel Charlann Boxer, M.D.     MDO/MEDQ  D:  06/17/2010  T:  06/17/2010  Job:  [id number]  Electronically Signed by Durene Romans M.D. on 06/18/2010 10:43:08 AM

## 2010-06-26 NOTE — Consult Note (Signed)
  NAMEWELTON, [name]                 ACCOUNT NO.:  [id number]  MEDICAL RECORD NO.:  [id number]           PATIENT TYPE:  I  LOCATION:  1430                         [facility]ACILITY:  [facility]  PHYSICIAN:  Lyn Records, M.D.   DATE OF BIRTH:  1927-08-16  DATE OF CONSULTATION: DATE OF DISCHARGE:                                CONSULTATION   PROCEDURE PERFORMED:  Elective electrical cardioversion for conversion of atrial tachycardia/atrial flutter/atrial fib in a patient with poorly controlled ventricular response and CHF.  DESCRIPTION:  Dr. Council Mechanic provided anesthesia coverage.  The patient was intubated after being given propofol.  Once asleep, the patient was converted using a single shock of 200 watt-seconds of energy that reverted the atrial tachycardia/atrial fibrillation to normal sinus rhythm.  He awakened from the anesthesia without difficulty.  He was extubated.  No complications occurred.  Postprocedure EKG demonstrated normal sinus rhythm at a rate of 89 beats per minute.  PR interval was 209 seconds.  CONCLUSION:  Successful conversion to normal sinus rhythm.  PLAN:  Continue amiodarone loading and metoprolol for rate control.  We will continue to follow the patient.  Eligible for discharge within the next 24 hours.     Lyn Records, M.D.     HWS/MEDQ  D:  06/16/2010  T:  06/17/2010  Job:  [id number]  cc:   Theressa Millard, M.D. Fax: [phone]  Madlyn Frankel. Charlann Boxer, M.D. Fax: [phone]  Electronically Signed by Verdis Prime M.D. on 06/25/2010 09:28:20 AM

## 2010-07-09 ENCOUNTER — Ambulatory Visit: Payer: Medicare Other | Admitting: Rehabilitation

## 2010-07-09 ENCOUNTER — Ambulatory Visit (HOSPITAL_COMMUNITY)
Admission: RE | Admit: 2010-07-09 | Discharge: 2010-07-09 | Disposition: A | Discharge status: Home / Self Care | Payer: Medicare Other | Source: Ambulatory Visit | Attending: Interventional Cardiology | Admitting: Interventional Cardiology

## 2010-07-09 DIAGNOSIS — Z01810 Encounter for preprocedural cardiovascular examination: Secondary | ICD-10-CM | POA: Insufficient documentation

## 2010-07-09 DIAGNOSIS — I5020 Unspecified systolic (congestive) heart failure: Secondary | ICD-10-CM | POA: Insufficient documentation

## 2010-07-09 DIAGNOSIS — I498 Other specified cardiac arrhythmias: Secondary | ICD-10-CM | POA: Insufficient documentation

## 2010-07-13 LAB — DIFFERENTIAL
Eosinophils Relative: 5 % (ref 0–5)
Lymphocytes Relative: 24 % (ref 12–46)
Lymphs Abs: 2.4 10*3/uL (ref 0.7–4.0)
Monocytes Absolute: 0.7 10*3/uL (ref 0.1–1.0)
Monocytes Relative: 8 % (ref 3–12)

## 2010-07-13 LAB — URINALYSIS, ROUTINE W REFLEX MICROSCOPIC
Glucose, UA: NEGATIVE mg/dL
Leukocytes, UA: NEGATIVE
Protein, ur: NEGATIVE mg/dL
Specific Gravity, Urine: 1.013 (ref 1.005–1.030)
Urobilinogen, UA: 0.2 mg/dL (ref 0.0–1.0)

## 2010-07-13 LAB — URINE CULTURE: Culture: NO GROWTH

## 2010-07-13 LAB — URINE MICROSCOPIC-ADD ON

## 2010-07-13 LAB — CBC
HCT: 49.9 % (ref 39.0–52.0)
Hemoglobin: 17 g/dL (ref 13.0–17.0)
WBC: 9.7 10*3/uL (ref 4.0–10.5)

## 2010-07-13 LAB — BASIC METABOLIC PANEL
GFR calc non Af Amer: 59 mL/min — ABNORMAL LOW (ref >60)
Potassium: 4.2 mEq/L (ref 3.5–5.1)
Sodium: 135 mEq/L (ref 135–145)

## 2010-07-15 ENCOUNTER — Ambulatory Visit: Payer: Medicare Other | Attending: Orthopedic Surgery | Admitting: Rehabilitation

## 2010-07-15 DIAGNOSIS — M25669 Stiffness of unspecified knee, not elsewhere classified: Secondary | ICD-10-CM | POA: Insufficient documentation

## 2010-07-15 DIAGNOSIS — R5381 Other malaise: Secondary | ICD-10-CM | POA: Insufficient documentation

## 2010-07-15 DIAGNOSIS — R262 Difficulty in walking, not elsewhere classified: Secondary | ICD-10-CM | POA: Insufficient documentation

## 2010-07-15 DIAGNOSIS — M25569 Pain in unspecified knee: Secondary | ICD-10-CM | POA: Insufficient documentation

## 2010-07-15 DIAGNOSIS — IMO0001 Reserved for inherently not codable concepts without codable children: Secondary | ICD-10-CM | POA: Insufficient documentation

## 2010-07-21 ENCOUNTER — Ambulatory Visit: Payer: Medicare Other | Attending: Orthopedic Surgery | Admitting: Physical Therapy

## 2010-07-21 ENCOUNTER — Encounter: Payer: Medicare Other | Admitting: Physical Therapy

## 2010-07-21 DIAGNOSIS — M25569 Pain in unspecified knee: Secondary | ICD-10-CM | POA: Insufficient documentation

## 2010-07-21 DIAGNOSIS — R262 Difficulty in walking, not elsewhere classified: Secondary | ICD-10-CM | POA: Insufficient documentation

## 2010-07-21 DIAGNOSIS — M25669 Stiffness of unspecified knee, not elsewhere classified: Secondary | ICD-10-CM | POA: Insufficient documentation

## 2010-07-21 DIAGNOSIS — R5381 Other malaise: Secondary | ICD-10-CM | POA: Insufficient documentation

## 2010-07-21 DIAGNOSIS — IMO0001 Reserved for inherently not codable concepts without codable children: Secondary | ICD-10-CM | POA: Insufficient documentation

## 2010-07-22 ENCOUNTER — Ambulatory Visit: Payer: Medicare Other | Admitting: Rehabilitation

## 2010-07-23 ENCOUNTER — Encounter: Payer: Medicare Other | Admitting: Physical Therapy

## 2010-07-23 ENCOUNTER — Ambulatory Visit: Payer: Medicare Other | Admitting: Physical Therapy

## 2010-07-27 ENCOUNTER — Ambulatory Visit: Payer: Medicare Other | Admitting: Physical Therapy

## 2010-07-29 ENCOUNTER — Ambulatory Visit: Payer: Medicare Other | Admitting: Rehabilitation

## 2010-07-30 ENCOUNTER — Ambulatory Visit: Payer: Medicare Other | Admitting: Rehabilitation

## 2010-08-03 ENCOUNTER — Ambulatory Visit: Payer: Medicare Other | Admitting: Physical Therapy

## 2010-08-04 LAB — DIFFERENTIAL
Eosinophils Absolute: 0.4 10*3/uL (ref 0.0–0.7)
Lymphocytes Relative: 21 % (ref 12–46)
Lymphs Abs: 1.5 10*3/uL (ref 0.7–4.0)
Monocytes Relative: 6 % (ref 3–12)
Neutrophils Relative %: 67 % (ref 43–77)

## 2010-08-04 LAB — URINALYSIS, ROUTINE W REFLEX MICROSCOPIC
Glucose, UA: NEGATIVE mg/dL
Leukocytes, UA: NEGATIVE
Protein, ur: NEGATIVE mg/dL
pH: 6 (ref 5.0–8.0)

## 2010-08-04 LAB — POCT CARDIAC MARKERS
CKMB, poc: 1 ng/mL (ref 1.0–8.0)
CKMB, poc: 2.8 ng/mL (ref 1.0–8.0)
Myoglobin, poc: 237 ng/mL (ref 12–200)
Troponin i, poc: <0.05 ng/mL (ref 0.00–0.09)

## 2010-08-04 LAB — COMPREHENSIVE METABOLIC PANEL
ALT: 12 U/L (ref 0–53)
Calcium: 9.2 mg/dL (ref 8.4–10.5)
Creatinine, Ser: 1.17 mg/dL (ref 0.4–1.5)
GFR calc Af Amer: >60 mL/min (ref >60)
GFR calc non Af Amer: >60 mL/min (ref >60)
Glucose, Bld: 96 mg/dL (ref 70–99)
Sodium: 140 mEq/L (ref 135–145)
Total Protein: 6.5 g/dL (ref 6.0–8.3)

## 2010-08-04 LAB — RAPID URINE DRUG SCREEN, HOSP PERFORMED
Amphetamines: NOT DETECTED
Barbiturates: NOT DETECTED
Benzodiazepines: NOT DETECTED
Cocaine: NOT DETECTED
Opiates: NOT DETECTED
Tetrahydrocannabinol: NOT DETECTED

## 2010-08-04 LAB — CBC
MCHC: 35.5 g/dL (ref 30.0–36.0)
MCV: 90.7 fL (ref 78.0–100.0)
RDW: 13.6 % (ref 11.5–15.5)

## 2010-08-04 LAB — URINE MICROSCOPIC-ADD ON

## 2010-08-05 ENCOUNTER — Ambulatory Visit: Payer: Medicare Other | Admitting: Rehabilitation

## 2010-08-06 ENCOUNTER — Ambulatory Visit: Payer: Medicare Other | Admitting: Physical Therapy

## 2010-08-07 NOTE — Op Note (Signed)
  [name], [name] NO.:  [id number]  MEDICAL RECORD NO.:  [id number]           PATIENT TYPE:  O  LOCATION:  [facility]                         FACILITY:  [facility]  PHYSICIAN:  Lyn Records, M.D.   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  PROCEDURE:  Elective cardioversion.  DESCRIPTION:  The patient was noted to be in a continuous tachycardia between 102 and 110 beats per minute.  The patient was informed of the rhythm and the procedure to be performed.  This was a re-discussion.  I also consulted Dr. Gilman Schmidt concerning management regimen in this patient.  After loading with amiodarone, his rate is quite a bit slower than previous.  He had been previously running in the 118 to 120 beats per minute range.  Despite the slow heart rate, the patient has no energy and is breathless with exertion.  We decided to proceed with cardioversion.  Dr. Sampson Goon from Anesthesia saw the patient.  I places the electrodes on the anterior and posterior orientation in the left chest and back respectively.  In a synchronized mode after the patient was asleep (80 mg of propofol/40 mg Xylocaine), a single discharge of 75 watts was given.  He reverted to normal sinus rhythm at a rate of 65-70 beats per minute.  He awakened without complications. Anticoagulation for the procedure was Pradaxa.  He has been on Pradaxa now for nearly 6 weeks.  CONCLUSION:  Successful conversion from an atrial tachycardia at rates greater than 105 beats per minute to normal sinus rhythm at 70 beats per minute.  PLAN:  Decrease amiodarone to 200 mg per day.  Continue the higher dose metoprolol that was started 7 days ago.  Follow up in 1 week.     Lyn Records, M.D.     HWS/MEDQ  D:  07/09/2010  T:  07/10/2010  Job:  [id number]  cc:   Theressa Millard, M.D. Madlyn Frankel Charlann Boxer, M.D.  Electronically Signed by Verdis Prime M.D. on 08/07/2010 04:55:32 PM

## 2010-08-10 ENCOUNTER — Ambulatory Visit: Payer: Medicare Other

## 2010-08-12 ENCOUNTER — Ambulatory Visit: Payer: Medicare Other

## 2010-08-13 ENCOUNTER — Encounter: Payer: Medicare Other | Admitting: Rehabilitation

## 2010-08-17 ENCOUNTER — Ambulatory Visit: Payer: Medicare Other | Admitting: Physical Therapy

## 2010-08-18 DIAGNOSIS — A499 Bacterial infection, unspecified: Secondary | ICD-10-CM

## 2010-08-18 HISTORY — DX: Urinary tract infection, site not specified: A49.9

## 2010-08-18 HISTORY — PX: JOINT REPLACEMENT: SHX530

## 2010-08-19 ENCOUNTER — Ambulatory Visit: Payer: Medicare Other | Attending: Orthopedic Surgery | Admitting: Rehabilitation

## 2010-08-19 DIAGNOSIS — R262 Difficulty in walking, not elsewhere classified: Secondary | ICD-10-CM | POA: Insufficient documentation

## 2010-08-19 DIAGNOSIS — M25669 Stiffness of unspecified knee, not elsewhere classified: Secondary | ICD-10-CM | POA: Insufficient documentation

## 2010-08-19 DIAGNOSIS — M25569 Pain in unspecified knee: Secondary | ICD-10-CM | POA: Insufficient documentation

## 2010-08-19 DIAGNOSIS — IMO0001 Reserved for inherently not codable concepts without codable children: Secondary | ICD-10-CM | POA: Insufficient documentation

## 2010-08-19 DIAGNOSIS — R5381 Other malaise: Secondary | ICD-10-CM | POA: Insufficient documentation

## 2010-08-20 ENCOUNTER — Ambulatory Visit: Payer: Medicare Other | Admitting: Rehabilitation

## 2010-08-20 ENCOUNTER — Encounter: Payer: Medicare Other | Admitting: Rehabilitation

## 2010-08-24 ENCOUNTER — Ambulatory Visit: Payer: Medicare Other | Admitting: Rehabilitative and Restorative Service Providers"

## 2010-08-26 ENCOUNTER — Ambulatory Visit: Payer: Medicare Other | Admitting: Rehabilitation

## 2010-08-27 ENCOUNTER — Ambulatory Visit: Payer: Medicare Other | Admitting: Rehabilitation

## 2010-08-31 ENCOUNTER — Ambulatory Visit: Payer: Medicare Other | Admitting: Physical Therapy

## 2010-09-01 NOTE — Op Note (Signed)
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   PROCEDURE:  1. Lumbar re-exploration with exploration of fusion, L3-L5  2. Removal of segmental fixation, L3-L5.  3. Lumbar decompressive laminectomy, hemifacetectomy with      foraminotomies at L2-3 requiring more work than is typically      required a typical posterior lumbar interbody fusion procedure.  4. Post lumbar interbody fusion, L2-3 utilizing a 12 mm PEEK interbody      cage packed with local autograft and Actifuse and a 12 x 22 mm      tangent interbody bone wedge.  The midline was packed with local      autograft, Actifuse and a BMP soaked sponge.  5. Intertransverse arthrodesis, L2-3, utilizing Actifuse local      autograft and BMP soaked sponge.  6. Nonsegmental fixation L2-3, utilizing the Legacy pedicle screw      system.   SURGEON:  Dr. Marikay Alar.   ASSISTANT:  Donalee Citrin, M.D.   ANESTHESIA:  General endotracheal.   COMPLICATIONS:  None apparent.   INDICATIONS FOR PROCEDURE:  [name] is a very pleasant 77 year old  gentleman who underwent a two-level post lumbar interbody fusion in the  remote past.  He developed severe bilateral leg pain in the anterior  thighs with some numbness.  He had an MRI and CT scan which showed  severe adjacent level stenosis at L2-3 with a solid fusion at L3-4 and  L4-5.  I recommended lumbar re-exploration with removal of the hardware  from L3-L5 and a post lumbar interbody fusion L2-3.  He understood the  risks, benefits, expected outcome and wished to proceed.   DESCRIPTION OF PROCEDURE:  The patient was taken to the operating room  and after induction of adequate generalized endotracheal anesthesia, he  was rolled into the prone position on the Wilson frame on the chest  rolls.  All pressure points padded.  His lumbar region was prepped with  DuraPrep and draped in usual sterile fashion.  10 mL local anesthesia  was injected and a dorsal midline incision was made through the old  incision and carried down to the lumbosacral fascia.  The fascia was  opened and the paraspinous musculature was taken down in sub periosteal  fashion to expose L1-L2, L2-L3, L3-L4, L4-L5.  I took the dissection out  over the hardware at L3-4 and L4-5.  Self-retaining retractors were  placed.  I removed the locking caps from the rods and then removed the  rods  and cross link.  I then pulled on each screw successively and all  six screws moved as a unit. Therefore I felt like I had a solid fusion  L3-4 and L4-5 consistent with the findings on the CT scan.  I then  removed the screws at L4 and L5 and placed Gelfoam into the pedicle  screw holes.  The screws at L3 were extremely tight and had some bone  growth around them.  Therefore I decided to leave these rather than  remove them and place a larger screw.  I then removed the spinous  process of L2 and performed a complete laminectomy and hemifacetectomy  to decompress the central canal and the nerve roots.  There was  significant overgrown yellow ligament causing very severe spinal  stenosis at this level.  Wide foraminotomies were performed.  The L2 and  the L3 nerve roots were identified and decompressed well into the  foramina.  The epidural venous vasculature was coagulated.  We found a  significant annular tear on the patient's right side with almost empty  disk space.  He had suffered  a large disk herniation on that side  several months ago and it appears this was the annular hole from that  large disk herniation though the fragment itself had become much smaller  than on previous imaging months ago.  We incised the annulus  bilaterally, performed initial diskectomy with pituitary rongeurs and  curettes and distracted the disk space up to 12 mm.  We then used a 12  mm rotating cutter and cutting chisel to prepare the endplates, the  midline was prepared with Epstein curettes.  A complete diskectomy was  performed.  We then placed a 12 x 22 mm PEEK interbody cage in the  interspace on the right-side.  It was packed with local autograft and  Actifuse.  We packed the midline with local autograft, Actifuse and BMP  soaked sponge and placed a 12 x 22 mm tangent interbody bone wedge into  the interspace at L2-3 on the left.  We then localized the pedicle screw  entry zones at L2 utilizing surface landmarks and lateral fluoroscopy.  We probed each pedicle with pedicle probe, tapped each pedicle with a  5.5 tap and placed 6.5 x 45 mm pedicle screws into the pedicles of L2  bilaterally.  We then decorticated the transverse processes and placed a  mixture of local autograft, Actifuse and BMP soaked sponge out over  these to perform intertransverse arthrodesis L2-3.  We then placed  lordotic rods into the multiaxial screw heads of the pedicle screws and  locked these in position with locking caps and antitorque device.  We  then irrigated with saline solution containing bacitracin and placed a  separate cross-link.  I inspected our nerve roots once again, checked AP  and lateral fluoroscopy to check our construct.  We then placed a medium  Hemovac drain through a separate stab incision and closed the muscle and  fascia with 0-0 Vicryl, closing subcutaneous and subcuticular tissue  with 2-0 and 3-0 Vicryl, closed the skin with Benzoin Steri-Strips.  The  drapes removed.  Sterile  dressing was applied.  The patient was awakened  from anesthesia and transferred to recovery room stable condition.  At  the end of the procedure, all sponge, needle and sponge counts were  correct.      Tia Alert, MD  Electronically Signed     DSJ/MEDQ  D:  06/07/2007  T:  06/08/2007  Job:  [id number]

## 2010-09-02 ENCOUNTER — Ambulatory Visit: Payer: Medicare Other

## 2010-09-03 ENCOUNTER — Ambulatory Visit: Payer: Medicare Other | Admitting: Physical Therapy

## 2010-09-04 NOTE — Op Note (Signed)
[name], [name]                           ACCOUNT NO.:  [id number]   MEDICAL RECORD NO.:  [id number]                   PATIENT TYPE:  OIB   LOCATION:  3030                                 FACILITY:  [facility]   PHYSICIAN:  Tia Alert, MD                  DATE OF BIRTH:  1928/02/18   DATE OF PROCEDURE:  DATE OF DISCHARGE:                                 OPERATIVE REPORT   DATE OF OPERATION:  December 12, 2003.   PREOPERATIVE DIAGNOSIS:  Lumbar spinal stenosis, L3-4, to bilateral leg  pain.   POSTOPERATIVE DIAGNOSIS:  Lumbar spinal stenosis, L3-4, to bilateral leg  pain.   PROCEDURE:  Decompressive lumbar hemilaminectomy, medial facetectomy, and  foraminotomy, L3-4 on the left, with a sublaminar decompression for central  canal and right lateral recess decompression.   SURGEON:  Tia Alert, MD.   ASSISTANT:  Reinaldo Meeker, MD.   ANESTHESIA:  General endotracheal.   COMPLICATIONS:  None apparent.   INDICATIONS FOR PROCEDURE:  [name] is a 77 year old white male, who had  undergone a previous hemilaminectomy at L4-5 on the right by another  physician.  He had complaints of bilateral leg pain, which was consistent  with neurogenic claudication.  He had a CT myelogram, which showed almost a  complete myelographic block at L3-4.  I recommended a lumbar decompressive  laminectomy at L3-4.  He had had an epidural steroid injection with some  relief, however, return of his symptoms that was getting progressively  worse.  He understood the risks, benefits, and alternatives and wished to  proceed.   DESCRIPTION OF PROCEDURE:  The patient was taken to the operating room and  after induction of adequate general endotracheal anesthesia, he was rolled  into the prone position on the Wilson frame, and all pressure points were  padded.  His lumbar region was prepped with Duraprep and then draped in the  usual sterile fashion.  8 mL of local anesthesia were injected, and then a  dorsal midline incision was made and carried down to the lumbosacral fascia.  The fascia was opened on the left side and taken down in a separate  periosteal fashion to expose the L3-4 interspace.  Intraoperative x-ray  confirmed our level, and then a combination of Kerrison punches, and the  high-speed black max air powered drill was used to perform a  hemilaminectomy, medial facetectomy, and foraminotomy at L3-4 on the left  side.  He had quite a bit of overgrown __________ ligament, which was  removed with the Kerrison punch to expose the underlying dura and L4 nerve  root.  I dissected out to the medial pedicle wall and decompressed the  lateral recess on the patient's left side.  I was then able to use the drill  to drill up under the spinous process and then perform a sublaminar  decompression from L3-4 on the  patient's left side to decompress the right  lateral recess out to the pedicle level.  I could identify the medical  pedicle wall and see the L4 nerve root on the patient's right side exiting  the foramen.  Once the decompression was complete, I palpated with a  coronary dilator to assure adequate decompression.  I then irrigated with  copious amounts of Bacitracin containing saline solution, lined the dura  with Gelfoam, removed the retractor, dried all bleeding points, and then  closed the fascia with an interrupted #1 Vicryl.  I closed the subcutaneous  and subcuticular tissue with 2-0 and 3-0 Vicryl, and closed the skin  with Dermabond.  The drapes were removed.  The patient was awakened from  general anesthesia and transported to the recovery room in stable condition.  At the end of the procedure, all sponge, needle, and instrument counts were  correct.                                               Tia Alert, MD    DSJ/MEDQ  D:  12/12/2003  T:  12/12/2003  Job:  [id number]

## 2010-09-04 NOTE — Op Note (Signed)
[name], [name]                 ACCOUNT NO.:  [id number]   MEDICAL RECORD NO.:  [id number]          PATIENT TYPE:  INP   LOCATION:  1422                         FACILITY:  [facility]   PHYSICIAN:  Boston Service, M.D.DATE OF BIRTH:  1927-07-05   DATE OF PROCEDURE:  DATE OF DISCHARGE:  07/01/2006                               OPERATIVE REPORT   DATE OF PROCEDURE:  06/28/2006 at 6:30 p.m.   4-mm stone left UVJ   Preoperative evaluation complicated by arrhythmia, atrial fibrillation  and ongoing need for intravenous heparin.  The patient was on IV heparin  at the time of the procedure.  Intractable pain due to 4-mm left UVJ  stone.  Decision made to proceed with cystoscopy under anesthesia with  retrograde and left double-J stent placement.  The plan was to hold off  on stone treatment or stone manipulation until the patient's need for  anticoagulant therapy had resolved.   POSTOPERATIVE DIAGNOSIS:  Same   PROCEDURE:  Cystoscopy, retrograde left double-J stent.   ANESTHESIA:  General.   DRAINS:  6-French 24-cm double-J stent.   DESCRIPTION OF PROCEDURE:  The patient was prepped and draped in the  dorsal lithotomy position after institution of an adequate level of  general anesthesia Vernice Jefferson, MD).  Well lubricated 21-French  panendoscope was gently inserted at the urethral meatus, normal urethra  and sphincter, nonobstructive prostate.  Clear reflux right orifice,  minimal reflux left orifice.   Blocking catheter was selected, positioned at the right ureteral orifice  with gentle injection of 3-6 mL of contrast.  Ureter, pelvis and calyces  were outlined without evidence of filling defect or obstruction.  Similar technique was used on the left side.  4-mm filling defect in the  left distal ureter was identified.  A soft floppy-tip guidewire was then  advanced into what appeared to be dilated upper pole calyces.  The end-  hole catheter was withdrawn and a 6-French 26-cm  double-J stent was then  gently advanced over the guidewire with excellent pigtail formation on  guidewire removal.  Bladder was drained.  Cystoscope was removed.  The  patient was returned to recovery in satisfactory condition.           ______________________________  Boston Service, M.D.     RH/MEDQ  D:  07/18/2006  T:  07/18/2006  Job:  [id number]

## 2010-09-04 NOTE — Op Note (Signed)
NAMEKOURY, RODDY                 ACCOUNT NO.:  [id number]   MEDICAL RECORD NO.:  [id number]          PATIENT TYPE:  OIB   LOCATION:  1504                         FACILITY:  [facility]   PHYSICIAN:  Georges Lynch. Gioffre, M.D.DATE OF BIRTH:  07-Oct-1927   DATE OF PROCEDURE:  03/15/2006  DATE OF DISCHARGE:                                 OPERATIVE REPORT   SURGEON:  Georges Lynch. Gioffre, M.D.   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   OPERATION:  1. Closed manipulation of a basically frozen left total knee.  2. Injection 20 mL 0.5% Marcaine with epinephrine into the knee joint      after sterile prep.   DESCRIPTION OF PROCEDURE:  The patient was given Ancef and gentamicin preop,  this is because of his previous coronary condition and this was provided by  the anesthesiologist.  Under general anesthesia, the gentle closed  manipulation of his left total knee was carried out.  Note, he had severe  adhesions of his knee.  I was able to flex him back to about 115 degrees and  a photograph was taken to show the patient that this could be done.  I then  took his knee through several gentle manipulation.  I then did a sterile  prep and injected 20 mL of 0.5% Marcaine with epinephrine into the knee  joint.  The patient was returned to the recovery room and will be placed in  a knee machine.  He will be kept overnight.           ______________________________  Georges Lynch. Darrelyn Hillock, M.D.     RAG/MEDQ  D:  03/15/2006  T:  03/15/2006  Job:  [id number]

## 2010-09-04 NOTE — Op Note (Signed)
NAMEHANDY, MCLOUD NO.:  [id number]   MEDICAL RECORD NO.:  [id number]          PATIENT TYPE:  OIB   LOCATION:  2860                         FACILITY:  [facility]   PHYSICIAN:  Tia Alert, MD     DATE OF BIRTH:  1927-09-20   DATE OF PROCEDURE:  09/21/2004  DATE OF DISCHARGE:                                 OPERATIVE REPORT   PREOPERATIVE DIAGNOSIS:  Far lateral disk herniation, L3-4 on the right with  right L3 radiculopathy.   POSTOPERATIVE DIAGNOSIS:  Far lateral disk herniation, L3-4 on the right  with right L3 radiculopathy.   PROCEDURE:  Lumbar re-exploration with far lateral extraforaminal  microdiskectomy L3-4 on the right utilizing microscopic dissection.   SURGEON:  Tia Alert, M.D.   ASSISTANT:  Donalee Citrin, M.D.   ANESTHESIA:  General endotracheal.   COMPLICATIONS:  Small dural tear.   BRIEF HISTORY OF PRESENT ILLNESS:  Mr. Befort is a 77 year old white male who  underwent about 2 weeks ago a lumbar hemilaminectomy, medial facetectomy,  and foraminotomy at L2-3, and L3-4 on the right for a right L3-L4  radiculopathy. A large synovial cyst was removed at that operation and L3-L4  nerve roots were decompressed. There were foraminal disk bulges but  palpation with a nerve hook into the foramen and with a coronary dilator to  the foramen showed no obvious compressive lesion around the L3 nerve root.  He did well following the surgery and was discharged home. He had severe  postoperative constipation requiring a trip to the emergency department for  disimpaction. However, during straining to have a bowel movement, he noticed  the onset of right anterior thigh pain. Medrol Dosepak significantly helped  this but then the pain returned. Therefore, we proceeded with MRI with  contrast. This showed a far lateral and foraminal disk herniation at L3-4 on  the right side which was not as obvious on his preoperative studies. He was  on Lyrica, Percocet, and  a Medrol Dosepak which did seem to help the pain  somewhat but he was losing strength in his anterior thigh and had some  muscle atrophy. I recommended a lumbar re-exploration with far lateral  diskectomy at L3-4 on the right side. We would also re-explore the  hemilaminectomy and dissect the nerve root out from lateral and medial  approaches at L3. The patient understood the risks, benefits, and  alternatives, and wished to proceed.   DESCRIPTION OF PROCEDURE:  The patient was taken to the operating room and  after induction of adequate generalized endotracheal anesthesia, his lumbar  region was prepped with DuraPrep and then draped in the usual sterile  fashion. The upper part of the incision was reopened and carried down  through the fascia. The paraspinous musculature was taken out once again and  the previous hemilaminectomy at L2-3 and L3-4 was identified on the right  side. The retractor was placed and taken out over the facets at L2-3 and L3-  4 to expose the transverse process of L3 and superior part of the facet of  L3-4. When  I inspected the previous decompression, there was an obvious  significant disk herniation in the axilla of the L3 nerve root extending up  around the dura to the lateral edge of the dura. This was an obvious disk  herniation that was not there at the completion of the previous  decompression just 2 weeks ago, so I felt this was a new disk herniation in  the foramen and far laterally at L3-4 and this point. This was removed with  pituitary rongeurs, quite a large free fragment that was removed from the  axilla. There was some obvious foraminal disk extending into the foramen.  Therefore, I decided to go ahead and proceed with the far lateral approach.  I used the high-speed drill to drill the superior part of facet of L3-4,  opened the yellow ligament, identified the L3 nerve root, and found the  previous nerve root block remnants over the L3 nerve root.  This was  decompressed down to the level of the disk. The epidural venous facet was  coagulated and the disk was incised at L3-4 extraforaminally, and the  diskectomy was done with pituitary rongeurs and a diskectomy was performed  at L3-4 with pituitary rongeurs. I used a nerve hook and a coronary dilator  to palpate under the L3 nerve root and removed a couple of more small free  fragments from underneath the nerve root. The coronary dilators were then  passed easily into the foramen. We inspected the nerve root once again,  dried all bleeding points during palpation in the axilla of the nerve root  with a nerve hook, a small dural rent was created. This was covered with  Gelfoam and  lined with Tisseel fibrin glue. We then lined everything with  Gelfoam once the decompression was complete, closed the fascia with  interrupted #1 Vicryl, closed the subcutaneous and subcuticular tissues with  2-0 and 3-0 Vicryl, and closed skin with Benzoin Steri-Strips. The drapes  were removed. A sterile dressing was applied. The patient was awakened from  anesthesia and transferred to recovery room in stable condition. At the end  of the procedure, all sponge, needle and instrument counts were correct.       DSJ/MEDQ  D:  09/21/2004  T:  09/21/2004  Job:  [id number]

## 2010-09-04 NOTE — Op Note (Signed)
[name], [name] NO.:  [id number]   MEDICAL RECORD NO.:  [id number]          PATIENT TYPE:  INP   LOCATION:  2899                         FACILITY:  [facility]   PHYSICIAN:  Tia Alert, MD     DATE OF BIRTH:  January 26, 1928   DATE OF PROCEDURE:  12/23/2004  DATE OF DISCHARGE:                                 OPERATIVE REPORT   PREOPERATIVE DIAGNOSES:  1.  Degenerative disease, L3-4, L4-5 with collapse of the disk space at L4-5      with severe foraminal stenosis, L4-5 bilaterally.  2.  Recurrent lumbar disk herniation, L3-4 on the right.  3.  Back pain.  4.  Severe right leg pain with muscle weakness and atrophy in the anterior      quadriceps.   POSTOPERATIVE DIAGNOSES:  1.  Degenerative disease, L3-4, L4-5 with collapse of the disk space at L4-5      with severe foraminal stenosis, L4-5 bilaterally.  2.  Recurrent lumbar disk herniation, L3-4 on the right.  3.  Back pain.  4.  Severe right leg pain with muscle weakness and atrophy in the anterior      quadriceps.   PROCEDURES:  1.  Lumbar re-exploration with repeat hemilaminectomy, hemifacetectomy and      foraminotomy at L3-4 and L4-5 on the right side, with decompression of      the L3, L4 and L5 nerve roots on the right and removal of recurrent      lumbar disk herniation, L3-4 on the right side.  2.  Transforaminal lumbar interbody fusion, L3-4 and L4-5 from the right      side using a 10-26 mm PEEK interbody cage packed with local autograft      and BMP at L3-L4, and  an 8 by 26 mm PEEK interbody cage packed with BMP      and local autograft at L4-5.  3.  Segmental fixation, L3 to L5 bilaterally, utilizing the Legacy pedicle      screw system.  4.  Intertransverse arthrodesis, L3 to L5 bilaterally, utilizing local      autograft mixed with BMP.   SURGEON:  Tia Alert, M.D.   ASSISTANT:  Donalee Citrin, M.D.   ANESTHESIA:  General endotracheal.   COMPLICATIONS:  None apparent.   INDICATIONS  FOR PROCEDURE:  Mr. [name] is a very pleasant 77 year old white  male who had undergone a previous lumbar laminectomy at L4-5 for spinal  stenosis many years ago.  He then underwent a left-sided L3-4 laminectomy  last year and had good results.  AtL3-4 this past year, he had a disk  herniation paracentral to the right and in the far lateral region at L3-4 on  the right side with synovial cyst at L4-5 on the right.  These were removed.  He did well initially and then had a recurrence of his pain.  An MRI showed  recurrent disk herniation at L3-4 on the right side.  He was reoperated on  and this was removed once again, both from an extraforaminal and from a  midline approach.  It then recurred once again he had severe pain.  He  failed medical management including nerve root blocks,  Nerve root block  suggested the had an L4 radiculopathy at this time and had severe stenosis  in the foramen at L4-5 on the right side from collapse of the disk space  from severe degenerative disk disease where he had undergone a previous  surgery there many years ago by another Careers adviser.  I recommended a lumbar re-  exploration with removal of the recurrent disk herniation at L3-4.  Because  this was this third recurrence, I recommended a fusion at this level.  I  also recommended a fusion at L3-4 with an interbody fusion to reestablish  disk space height and therefore reestablish neural foraminal diameter at L4-  5 on the right side.  He understood the risks, benefits and expected outcome  of this procedure and wished to proceed.   DESCRIPTION OF PROCEDURE:  The patient was taken to the operating room and  after induction of adequate generalized endotracheal anesthesia, he was  rolled into the prone position on chest rolls and all pressure points were  padded.  His lumbar region was prepped with Avagard and then DuraPrep and  then draped in the usual sterile fashion. Local anesthesia 10 mL was  injected and  then his old incision was ellipsed out and removed.  I then  took the dissection down to the paraspinous musculature, which was taken  down in subperiosteal fashion to expose L3-4 and L4-5.  Intraoperative  fluoroscopy confirmed my level and then I took the dissection out over the  facets and identified the transverse processes of L3, L4 and L5 bilaterally.  I then used a Leksell rongeur to remove the spinous processes of  L3 and -L4  and then performed a laminectomy, hemifacetectomy and foraminotomies at L3-4  and L4-5 on the right side.  There was quite a bit of scarring, and I was  able to work through this, identify bony edges, and then continue the  foraminotomies.  The L5 nerve root was easily decompressed.  The L4 nerve  root was also easily decompressed by following the pedicle out into the  foramen. The L3 nerve root was also easily decompressed.  There was a large  recurrent disk herniation just in the axilla of this nerve root and in the  foraminal space.  This was removed with a pituitary rongeur and a nerve hook  until the nerve was free.  I de-tethered to the L3-L4 nerve roots of any  scar tissue.  I was then able to perform a thorough intradiskal diskectomy  at L3-4 and at L4-5.  The L4-5 interspace was very collapsed and we  distracted this open with interbody distractors and then prepared the  endplates with curettes and scrapers.  We then used the 8 x 26 mm PEEK  interbody cage and packed this with BMP and local autograft saved during the  decompression, and we packed the disk space with BMP and with local  autograft and then tapped the interbody cage into position at L4-5 from the  right side.  We then prepared the endplates the same way L3-4.  We performed  a thorough intradiskal diskectomy and prepared the endplates with curved  curettes and scrapers.  We then packed the disk space with BMP and local autograft and then used a 10 x 26 mm PEEK cage in the interspace at  L3-4.  We then localized the pedicle screw entry  zones and probed each pedicle,  tapped each pedicle with five 5.5 and placed 6.5 x 45 mm pedicles into the  pedicle screws of L3, L4 and L5 bilaterally using fluoroscopic guidance.  We  then decorticated the transverse processes and placed a mixture of BMP and  local autograft out over these to perform intertransverse arthrodesis, L3 to  L5 bilaterally.  I then placed lordotic rods into the multiaxial screw heads  of the pedicle screws, locked these into position with the locking caps and  anti torque device and placed a separate cross-link.  The wound was  copiously irrigated with copious amounts of bacitracin-containing saline  solution prior to placement of the BMP.  A medium Hemovac drain was placed,  the dura was lined with Gelfoam, bleeding points were dried with Bovie  cautery and with bipolar cautery.  I then closed the muscle and fascia with  interrupted #1 Vicryl, closed the subcutaneous and subcuticular tissues with  2-0 and 3-0 Vicryl and closed the skin with Benzoin and Steri-Strips.  The  drapes removed, a sterile dressing was applied, and the patient was awakened  from general anesthesia and transported to the recovery room in stable  condition.  At the end of the procedure all sponge, needle and instrument  counts were correct.   HEMOSTASIS:  Detail far and on a      Tia Alert, MD  Electronically Signed    DSJ/MEDQ  D:  12/23/2004  T:  12/23/2004  Job:  [id number]

## 2010-09-04 NOTE — Consult Note (Signed)
NAMEAMARIYON, MAYNES NO.:  [id number]   MEDICAL RECORD NO.:  [id number]          PATIENT TYPE:  OIB   LOCATION:  1422                         FACILITY:  [facility]   PHYSICIAN:  Peter M. Swaziland, M.D.  DATE OF BIRTH:  20-Mar-1928   DATE OF CONSULTATION:  06/27/2006  DATE OF DISCHARGE:                                 CONSULTATION   HISTORY OF PRESENT ILLNESS:  Mr. Coller is a 77 year old white male who  presented with acute renal colic today.  His CT scan confirmed a left  ureteral stone.  He was to undergo left ureteroscopy this evening but on  his preop ECG, he was found to be in atrial flutter with a rapid  ventricular response at a rate of 140.  His OR was cancelled.  The  patient denies any chest pain, palpitations, or shortness of breath.  He  developed acute flank pain this morning, but currently his pain is much  better after morphine.  He has had no prior history of atrial  arrhythmia.  When he was hospitalized for knee surgery in October, 2007,  he developed PVCs that were treated conservatively by Dr. Katrinka Blazing.  He is  status post tissue aortic valve replacement in 2001.   PAST MEDICAL HISTORY:  1. Tissue aortic valve replacement in 2001.  2. Status post left total knee replacement.  3. History of hypertension.  4. PVCs.  5. Restless legs syndrome.  6. History of lumbar disk surgery.   MEDICATIONS:  1. Requip 25 mg b.i.d.  2. Proscar 25 mg per day.  3. Aspirin 81 mg per day.  4. Calcium 1200 mg daily.  5. Osteo Bi-Flex twice daily.  6. Niacin 500 mg q.i.d.  7. Vitamin C 500 daily.  8. Advil p.r.n.   ALLERGIES:  He reports an intolerance to COUMADIN, PENICILLIN, and  OXYCODONE.   FAMILY HISTORY:  Noncontributory.   SOCIAL HISTORY:  He is married.  He is retired.  He is a nonsmoker.  He  rarely drinks alcohol.   PHYSICAL EXAMINATION:  GENERAL:  Patient is an elderly white male in no  distress.  VITAL SIGNS:  His pulse is 140 and irregular.  His  blood pressure is  173/100.  Respirations are 20.  HEENT:  Unremarkable.  NECK:  He has no jugular venous distention or bruits.  LUNGS:  Clear.  CARDIAC:  Irregular rate and rhythm with normal valve sounds.  There is  no murmur or S3.  ABDOMEN:  Soft, nontender.  He has positive bowel sounds.  EXTREMITIES:  Without edema.  Pulses are 2+ and symmetric.  NEUROLOGIC:  Intact.   LABORATORY DATA:  His ECG shows atrial flutter, rapid ventricular  response.  He has left axis deviation and LVH with repolarization  abnormality.   CT of the abdomen shows left hydronephrosis with stone.   Sodium is 140, potassium 4.3, chloride 107, bicarb 24, BUN 26,  creatinine 1.76.  Glucose is 100.  Calcium is 9.  Hemoglobin is 14.8.   IMPRESSION:  1. Atrial flutter with rapid ventricular response, new onset.  2.  Renal colic with left ureteral stone.  3. Hypertension.  4. Tissue aortic valve replacement.  5. Renal insufficiency.   PLAN:  Patient will be admitted to telemetry.  Rule out myocardial  infarction with serial cardiac enzymes.  Will start oral beta blockade  and also start IV Cardizem for rate control.  At this point, we will  hold heparin, given his acute renal colic and imminent need for surgery;  however, may need to consider heparinization if patient remains in  atrial flutter.  Will also schedule an echocardiogram tomorrow.  At this  point, his surgery is on hold until his heart rate is adequately  controlled.  Dr. Katrinka Blazing will follow in the morning.           ______________________________  Peter M. Swaziland, M.D.     PMJ/MEDQ  D:  06/27/2006  T:  06/27/2006  Job:  [id number]   cc:   Excell Seltzer. Annabell Howells, M.D.  Fax: [phone]   Theressa Millard, M.D.  Fax: [phone]   Lyn Records, M.D.  Fax: [phone]

## 2010-09-04 NOTE — Procedures (Signed)
[place]  Patient:    [name], [name]                        MRN: [id number] Proc. Date: 06/02/99 Adm. Date:  [id number] Disc. Date: [id number] Attending:  Mikey Bussing                           Procedure Report  PROCEDURE PERFORMED:  Transesophageal echocardiographic monitoring for aortic valve replacement.  ANESTHESIOLOGIST:  Kaylyn Layer. Michelle Piper, M.D.  I was consulted by Kerin Perna III, M.D. to institute transesophageal echocardiographic monitoring during aortic valve replacement on [name] in order to quantitate and follow his aortic valve through this procedure.  DESCRIPTION OF PROCEDURE:  After uneventful induction of anesthesia, endotracheal intubation, passed the transesophageal echocardiographic probe easily into the stomach.  Initial short axis view of the left ventricle showed left ventricular hypertrophy.  The mitral valve looked normal.  The aortic valve was bicuspid in nature with severe calcification.  The rest of the examination was within normal limits.  The patient was then placed on cardiopulmonary bypass by Dr. Donata Clay and underwent an aortic valve replacement with a Carpentier-Edwards valve.  After the valve had been inserted into position prior to discontinuing cardiopulmonary bypass, imaging using a transesophageal echocardiogram was done, showed the valve in good position, functioning, working well and well seated.  The patient was then discontinued on cardiopulmonary bypass.  On looking at the valve after bypass, it looked like it was functioning well and the rest of the exam was unchanged.  The transesophageal probe was then removed from the patient after the chest was closed and the patient was taken to the intensive care unit in stable condition. DD:  07/23/99 TD:  07/25/99 Job: [id number]

## 2010-09-04 NOTE — Op Note (Signed)
NAMESHAIL, URBAS                 ACCOUNT NO.:  [id number]   MEDICAL RECORD NO.:  [id number]          PATIENT TYPE:  OIB   LOCATION:  2899                         FACILITY:  [facility]   PHYSICIAN:  Tia Alert, MD     DATE OF BIRTH:  05-29-1927   DATE OF PROCEDURE:  09/09/2004  DATE OF DISCHARGE:                                 OPERATIVE REPORT   PREOPERATIVE DIAGNOSIS:  Lumbar spondylosis with lateral recess stenosis of  L2-3 L3-4 on the right side with right L3 radiculopathy.   POSTOPERATIVE DIAGNOSES:  1.  Lumbar spondylosis with lateral recess stenosis of L3-4 on the right      side with right L3 radiculopathy.  2.  Synovial cyst at L3-4 on the right.   PROCEDURES:  1.  Decompressive hemilaminectomy, medial facetectomy and foraminotomy of L3-      4 on the right side for spinal stenosis and resection of synovial cyst.  2.  Right decompressive lumbar hemilaminectomy, medial facetectomy and      foraminotomy of L2-3 for lateral recess and nerve root decompression.  3.  Microdissection.   SURGEON:  Tia Alert, M.D.   ASSISTANT:  Donalee Citrin, M.D.   ANESTHESIA:  General endotracheal.   COMPLICATIONS:  None apparent.   INDICATIONS FOR THE PROCEDURE:  Mr. Dupler is a 77 year old white male who had  undergone a previous lumbar decompression at L4-5 by another surgeon. He  then developed stenosis at L3-4 with a left L4 radiculopathy about one year  ago and underwent a hemilaminectomy at L3-4 on the left side. He then  developed over the last four weeks severe right leg pain in an L3  distribution. He had an MRI and then CT myelogram which showed lateral  recess stenosis at L2-3 and L3-4 on the right side. On the myelogram there  was cut off of the right L3 nerve root sleeve. He underwent a right L3 nerve  root block with some relief. I recommended lumbar re-exploration with  decompressive hemilaminectomy at L2-3 and L3-4 on the right side. There was  some question of an  extradural mass either being a synovial cyst or a free  herniated disk fragment compressing the right L3 nerve root on the  myelogram. He understood the risks, benefits and expected outcome of the  procedure and wished to proceed with the surgery.   DESCRIPTION OF PROCEDURE:  The patient was taken to operating room. After  induction of adequate general endotracheal anesthesia, he was rolled into  the prone position on the Wilson frame and all pressure points were padded.  The lumbar region was prepped with DuraPrep and then draped in the usual  sterile fashion. Then 9 mL of local anesthesia were injected and then a  dorsal midline incision was made and carried down to the lumbosacral fascia.  The fascia was opened on the right side. Paraspinous musculature was taken  down in subperiosteal fashion to expose the L2-3 and L3-4 interspaces. An  intraoperative x-ray confirmed my level. Then utilizing the Kerrison punch  and the high-speed air powered Black Max  drill, a hemilaminectomy, medial  facetectomy and foraminotomy were performed at L2-3 and L3-4 on the right  side. The L3 nerve root was easily identified and dissected out to the  medial pedicle wall and could follow the nerve root out into the foramen. I  used a nerve hook and a coronary dilator to palpate around the nerve and  underneath the nerve. I found no free disk fragment as I continued to  dissect downward toward the L4 nerve root. I found a significant synovial  cyst at L3-4 on the right side and this was removed with careful dissection  to separate it  from the dura and the nerve root. Then it was removed in a  piecemeal fashion with the Kerrison punch. Once gross total resection of the  synovial cyst was completed, the nerve root looked free. I could palpate the  pedicle of L4 once again and followed the L4 nerve root out into the  foramen. I then inspected once again for herniated disk. There was a  subannular bulge at L3-4,  but it did not look compressive. I found no free  fragment once again even far laterally at the L3-4 interspace on the right  side. I then irrigated with saline solution containing bacitracin, dried all  bleeding points with bipolar cautery, lined the dura with Duragen to help  prevent epidural scarring and then used Gelfoam on top of this. I then  removed the retractor. I dried all bleeding points once again and closed the  fascia with interrupted 1-0 Vicryl, closed the subcutaneous and subcuticular  tissues with 2-0 and 3-0 Vicryl and closed the skin with Dermabond. The  drapes were removed. The patient was awakened from general anesthesia and  transported to the recovery room in stable condition. At the end of the  procedure all sponge, needle and sponge counts were correct.      DSJ/MEDQ  D:  09/09/2004  T:  09/09/2004  Job:  [id number]

## 2010-09-04 NOTE — H&P (Signed)
[name], [name]                 ACCOUNT NO.:  [id number]   MEDICAL RECORD NO.:  [id number]          PATIENT TYPE:  INP   LOCATION:  NA                           FACILITY[facility]   PHYSICIAN:  Georges Lynch. Gioffre, M.D.DATE OF BIRTH:  January 07, 1928   DATE OF ADMISSION:  02/02/2006  DATE OF DISCHARGE:                                HISTORY & PHYSICAL   CHIEF COMPLAINT:  Painful left knee.   HISTORY OF PRESENT ILLNESS:  The patient is a 77 year old gentleman who has  been a patient of Dr. Jeannetta Ellis for a long period of time and treated  conservatively his severe osteoarthritis in his left knee with conservative  treatments.  He has failed these.  He continues to have severe pain,  difficulty with ambulation.  He would like to proceed with a total knee  arthroplasty.   ALLERGIES:  PENICILLIN.  COUMADIN causing severe fevers, chills, shakes and  his mental status changes.  Intolerant to Vicodin just causing the patient to feel real weird but no  allergic reactions   CURRENT MEDICATIONS:  Proscar, Requip, niacin, glucosamine chondroitin,  vitamin C, calcium, aspirin and Extra Strength Tylenol.   PAST MEDICAL HISTORY:  1. Aortic valve replacement with bovine valve.  2. Restless leg syndrome.  3. BPH.   PAST SURGICAL HISTORY:  1. Multiple back surgeries.  2. Shoulder surgery.  The patient denies any complications with anesthesia other than significant  event of constipation.   FAMILY MEDICAL HISTORY:  Mother is deceased at the age of 62 from age  related issues. Father is deceased early in life from a drowning accident.   SOCIAL HISTORY:  The patient is married and lives with his wife.  The  patient is retired.  He has an occasional alcoholic beverage.  He denies any  smoking.  He lives in a one-story house and his wife will be caring for  postoperatively.   PRIMARY CARE PHYSICIAN:  Dr. Magda Paganini.   CARDIOLOGIST:  Dr. Verdis Prime.   REVIEW OF SYSTEMS:  Negative for any  neurologic, pulmonary, cardiovascular,  GI, GU, endocrine and hematologic complaints at this present time.   PHYSICAL EXAMINATION:  VITAL SIGNS:  Height is 5 feet 8, weight is 194  pounds, blood pressure is 122/84, pulse of 74 with an occasional skipped  beat, respirations 12, patient is afebrile.  GENERAL:  This is a healthy-appearing, well-developed 77 year old gentleman  conscious, alert and appropriate, ambulates with a fairly easy gait, gets on  and off the exam table without any obvious distress.  HEENT:  Head was normocephalic.  Pupils equal, round and reactive.  Extraocular movements intact.  Oral buccal mucosa is pink and moist.  NECK:  Supple.  No palpable lymphadenopathy.  He had excellent range of  motion of the cervical spine with any difficulty tenderness.  CHEST:  Lung sounds were clear and equal bilaterally.  No wheezes, rales,  rhonchi.  He had well-healed anterior chest incision.  HEART:  Slightly irregular with an occasional skipped beat with a systolic  ejection murmur.  ABDOMEN:  Soft, nontender.  Bowel sounds  active.  No hepatosplenomegaly.  No  CVA region tenderness.  EXTREMITIES:  Upper extremities were symmetrically sized and shaped.  He had  good range of motion of his shoulders, elbows and wrists, motor strength was  5/5.  LOWER EXTREMITIES:  The patient did have some soreness of both hips with  flexion, extension internal and external rotation, but it was not extreme  and he had good range of motion.  Both knees have full extension, flexion  back to 120 degrees with no instability, left knee was slightly swollen.  He  was tender along the medial and lateral joint line.  He had significant  crepitus.  Right knee was mildly swollen.  He was minimally tender along the  joint lines.  He had no effusions, no instability.  The calves were soft and  nontender.  He had large varicosities in the lower extremities.  Ankles were  symmetrical with good dorsi and plantar  flexion.  PERIPHERAL Vascular:  He had no carotid bruits.  Carotid pulses were  symmetrical.  Good radial pulses.  He had strong dorsalis pedis pulses.  NEUROLOGIC:  He was neurologically intact.  He was conscious, alert and  appropriate.  BREAST, RECTAL AND GU:  Exams were deferred at this time.   IMPRESSION:  1. End-stage osteoarthritis left knee with moderate arthritic changes      right knee.  2. History of aortic valve replacement.  3. History of benign prostatic hypertrophy.  4. History of restless leg syndrome.  5. Coumadin allergy.   PLAN:  The patient has been evaluated by Dr. Darrelyn Hillock, his cardiologist, and  is cleared for this upcoming surgery.  Will do all routine labs and tests  prior to having a left total knee arthroplasty done on February 02, 2006, at  [facility].      Jamelle Rushing, P.A.    ______________________________  Georges Lynch. Darrelyn Hillock, M.D.    RWK/MEDQ  D:  01/27/2006  T:  01/27/2006  Job:  [id number]

## 2010-09-04 NOTE — Discharge Summary (Signed)
[name], [name]                 ACCOUNT NO.:  [id number]   MEDICAL RECORD NO.:  [id number]          PATIENT TYPE:  INP   LOCATION:  1518                         FACILITY:  [facility]   PHYSICIAN:  Georges Lynch. Gioffre, M.D.DATE OF BIRTH:  1928/03/15   DATE OF ADMISSION:  02/02/2006  DATE OF DISCHARGE:  02/05/2006                                 DISCHARGE SUMMARY   ADMISSION DIAGNOSES:  1. End-stage osteoarthritis, left knee, with moderate arthritic changes,      right knee.  2. History of aortic valve replacement.  3. History of benign prostatic hypertrophy.  4. History of restless legs syndrome.  5. COUMADIN allergy.   DISCHARGE DIAGNOSES:  1. Left total knee arthroplasty.  2. Postoperative hypertension.  3. History of aortic valve replacement.  4. History of restless legs syndrome.  5. History of benign prostatic hypertrophy.  6. History of COUMADIN allergy.   HISTORY OF PRESENT ILLNESS:  The patient is a 77 year old gentleman with a  long history of arthritic changes within his knee tolerated with  conservative treatment.  The patient's advanced arthritis changes have  progressed to the point where the patient has failed conservative treatment  and is no longer able to do the activities he wants and has pain with range  of motion, difficulty with a stiff knee.  He would like to proceed with a  total knee arthroplasty.   ALLERGIES:  PENICILLIN, COUMADIN.   CURRENT MEDICATIONS:  Proscar, Requip, niacin, glucosamine and chondroitin,  vitamin C, calcium, aspirin, Extra Strength Tylenol.   SURGICAL PROCEDURE:  On January 26, 2006, the patient was taken to the OR by  Windy Fast A. Darrelyn Hillock, M.D., assisted by Jamelle Rushing, P.A.-C, general  anesthesia, underwent a left total knee arthroplasty with a DePuy rotating  platform system.  Estimated blood loss was 50 mL.  There were no  complications.  The patient was transferred to the recovery room and to the  orthopedics floor in good  condition.  The patient had the following  components implanted:  A size 4 left femoral component, a size 4 cemented-  keel tibial tray, and a size 38 3-peg patella, size 4 10 mm polyethylene  bearing.  All components were implanted with polymethyl methacrylate with  vancomycin.   CONSULTATIONS:  A cardiology consult was requested for postoperative  hypertension and some ectopy.   Routine physical therapy, case management and pharmacy consults were  requested.   HOSPITAL COURSE:  On February 02, 2006, the patient was admitted to [facility] under the care of Dr. Ranee Gosselin.  The patient was taken  to the OR, where a left total knee arthroplasty was performed without any  complications.  The patient was transferred to the recovery room and then to  the orthopedics floor in good condition with some hypertension, so a  cardiology consult was requested.  This was performed.  The proceeded with  some chemistry, TSH, a troponin I and a BNP chemistry workup.  It was felt  to be some elevated hypertension and just postoperative ectopy, and he was  placed on  metoprolol and was allowed to monitor with just adjustment of the  Toprol dose.  No other complications or problems throughout his  hospitalization.  Remained stable from a cardiac standpoint throughout his  hospitalization.   From the orthopedic standpoint, the patient's wound remained benign for any  signs of infection.  His leg remained neuro, motor and vascularly intact.  His wound remained intact.  He proceeded fairly well with physical therapy.  The patient had no other untoward events throughout his hospitalization from  a medical standpoint.  On postoperative day #3 the patient felt he was ready  to go home and was cleared by cardiology and was discharged in good  condition with outpatient instructions.   LABORATORY DATA:  H&H on October 20 found hemoglobin 12.0, hematocrit 34.4.  Routine chemistries found on the  17th a sodium of 142, potassium of 4.6,  glucose 143, BUN 18, creatinine 1.3.  The elevated glucose was felt to be  due to postoperative stress and inactivity.  Troponin I on the 17th was  0.02.  TSH was 0.960.  A BNP was 160.  EKG on October 10 was sinus rhythm  with premature ventricular complexes at 74 beats per minute.   MEDICATIONS UPON DISCHARGE FROM THE ORTHOPEDICS FLOOR:  1. Lopressor 25 mg p.o. b.i.d.  2. Proscar 5 mg p.o. daily.  3. Requip 0.5 mg q.h.s.  4. Vitamin C 500 mg daily.  5. Calcium carbonate one tablet daily.  6. Colace 100 mg p.o. b.i.d.  7. Ferrous sulfate 325 mg three times a day.  8. Percocet one or two tablets every 4-6 hours p.r.n.  9. Reglan 10 mg q.8h. p.r.n.  10.Phenergan 25 mg p.o. q.6h. p.r.n.  11.Lovenox 30 mg subcu q.24h.  12.Robaxin 500 mg p.o. t.i.d.  13.Antacid Plus p.r.n.   DISCHARGE INSTRUCTIONS:  1. Diet:  No restriction.  2. Activity:  The patient is to walk with the assistance of a walker.  3. Wound care:  The patient is to change dressing daily.  4. Medications:  The patient is to take routine home medications with the      addition of Lovenox 40 mg subcu daily for 10 days.  Metoprolol ER 50 mg      at night time.   FOLLOW-UP:  The patient needs a follow-up appointment with Dr. Darrelyn Hillock 2  weeks from discharge.  The patient is to call for an appointment.   The patient needs a follow-up appointment with Dr. Katrinka Blazing, the cardiologist,  in his office 1-2 weeks from discharge.  The patient is to call for  appointment.   [facility] is to provide home health physical therapy.   The patient's condition upon discharge to home is listed as improved and  good, stable from the orthopedic standpoint and cardiology standpoint.      Jamelle Rushing, P.A.    ______________________________  Georges Lynch Darrelyn Hillock, M.D.    RWK/MEDQ  D:  02/16/2006  T:  02/16/2006  Job:  [id number]   cc:   Lyn Records, M.D.  Fax: [phone]

## 2010-09-04 NOTE — Op Note (Signed)
NAME:  [name], [name] NO.:  [id number]   MEDICAL RECORD NO.:  [id number]                    PATIENT TYPE:   LOCATION:                                       FACILITY:   PHYSICIAN:  Danise Edge, M.D.                DATE OF BIRTH:   DATE OF PROCEDURE:  06/04/2002  DATE OF DISCHARGE:                                 OPERATIVE REPORT   PROCEDURE:  Colonoscopy with polypectomy.   INDICATIONS:  The patient is a 77 year old male born [birth year]. The  patient has recovered from a bout of acute diverticulitis treated with  antibiotics. He is scheduled to undergo a followup colonoscopy with  polypectomy to prevent colon cancer. The patient has undergone screening  flexible proctosigmoidoscopies performed by Dr. Theressa Millard in the past  but has never undergone a colonoscopy.   ENDOSCOPIST:  Danise Edge, M.D.   PREMEDICATION:  Versed 5 mg, Demerol 50 mg.   ENDOSCOPE:  Olympus colonoscope.   DESCRIPTION OF PROCEDURE:  After informed consent was obtained the patient  was placed in the left lateral decubitus position. I administered  intravenous Demerol and intravenous Versed to achieve conscious sedation for  the procedure. The patient's blood pressure, O2 saturation and cardiac  rhythm were monitored throughout the procedure and documented in the medical  record.   Anal inspection was normal. Digital rectal exam revealed a nonnodular  prostate.   The Olympus adult colonoscope was introduced into the rectum and easily  advanced to the cecum with the patient remaining in the left lateral  decubitus position. Colonic preparation for the examination today was  excellent.   Rectum:  Normal.   Sigmoid colon and descending colon:  Left colonic diverticulosis without  signs of diverticulitis or diverticular stricture formation.   Splenic flexure:  Normal.   Transverse colon:  Normal.   Hepatic flexure:  Normal.   Ascending colon:  From  the proximal ascending colon a diminutive 0.5 mm  sessile polyp was removed with the cold biopsy forceps.   Cecum and ileocecal valve:  Normal.    ASSESSMENT:  1. Left colonic diverticulosis.  2. A diminutive 0.5 mm sessile polyp was removed from the proximal ascending     colon.                                                 Danise Edge, M.D.    MJ/MEDQ  D:  06/04/2002  T:  06/04/2002  Job:  [id number]   cc:   Lyn Records III, M.D.  [address]  [address]  [place]  [place] [address]  Fax: [phone]   Theressa Millard, M.D.  [address] [place]  [place] [address]  Fax:  [phone]  

## 2010-09-04 NOTE — Op Note (Signed)
[place]  Patient:    [name], [name]                        MRN: [id number] Proc. Date: 06/02/99 Adm. Date:  [id number] Attending:  Mikey Bussing CC:         CVTS Office             Garnette Scheuermann, M.D., [facility]             Winn Jock. Earl Gala, M.D.                           Operative Report  PROCEDURE:  Aortic valve replacement with a 23-mm pericardial bioprosthesis.  PREOPERATIVE DIAGNOSIS:  Bicuspid aortic stenosis with syncope and transvalvular gradient of 60 mmHg.  POSTOPERATIVE DIAGNOSIS:  Bicuspid aortic stenosis with syncope and transvalvular gradient of 60 mmHg.  SURGEON:  Mikey Bussing, M.D.  ASSISTANT:  Lynnda Shields, P.A.-C.  ANESTHESIA:  General.  INDICATIONS:  The patient is a 77 year old male who had an episode of syncope four days prior to surgery.  He had known aortic stenosis and was admitted and ruled out for MI.  Cardiac catheterization demonstrated severe aortic stenosis with a calculated valve area of 0.5 with transvalvular gradient of 55 mmHg mean pressure. His coronaries had no significant disease, and he had left ventricular hypertrophy. He was referred for aortic valve replacement.  Prior to the operation, the patient was examined in his hospital room following  cardiac catheterization.  The results of the cardiac catheterization and prior cho studies were reviewed with the patient.  The indications and expected benefits f aortic valve replacement were discussed with the patient.  The details of the operation including the choice of valve, the placement of surgical incision, the use of cardiopulmonary bypass and general anesthesia, and the expected recovery  were reviewed with the patient.  We also discussed the potential risks associated with the operation including the risks of MI, CVA, bleeding, infection, and death. He understood that a tissue valve would allow him more flexible  activity without Coumadin requirement, and he was in favor of having the valve replaced with a tissue valve, specifically the pericardial bioprosthesis.  He understood that he may require short-term Coumadin after such a valve replacement.  He understood these implications and risks of the operation and agreed to proceed with the operation as planned under informed consent.  OPERATIVE FINDINGS:  The aortic valve was bicuspid and totally calcified and immobile.  He had severe aortic stenosis.  He had significant left ventricular hypertrophy.  He had evidence of coagulopathy post pump with diffuse oozing of he mediastinal fat, pericardium, periosteum, and skin and was given a transfusion f platelets after the protamine was administered.  DESCRIPTION OF PROCEDURE:  The patient was brought to the operating room and placed supine on the operating table where general anesthesia was induced under invasive hemodynamic monitoring.  The transesophageal echo probe was placed by the anesthesiologist, Dr. Arta Bruce, and the diagnosis of aortic stenosis was confirmed.  The patient was prepped and draped as a sterile field and a median sternotomy was performed.  The pericardium was opened, and heparin was administered.  There were pursestrings placed in the ascending aorta and right atrium.  The patient was cannulated and placed on cardiopulmonary bypass and cooled to 32 degrees.  Cardioplegia cannulas were placed for both antegrade and  retrograde delivery with cold-blood cardioplegia.  A left ventricular vent was placed via he right superior pulmonary vein.  The patient was cooled to 28 degrees, and the aorta cross-clamp was applied.  Cold-blood cardioplegia, 700 cc, was delivered,  both antegrade and retrograde with immediate cardioplegic arrest and ______ temperature dropped in less than 12 degrees.  Topical ______ saline flush were used augment myocardial preservation and a pericardial  insulator pad used to protect the left phrenic nerve.  The aortotomy was then performed 2 cm above the annulus.  The aortic valve was inspected.  It was heavily calcified, bicuspid, and immobile.  It was removed and a significant amount of annular calcification was also debrided.  All calcium particles were carefully and meticulously removed.  The root was irrigated with  copious amounts of cold saline.  The annulus was sized to a 23 pericardial tissue valve.  The 2-0 pledgeted mattress sutures were then placed around the annulus,  numbering 16 sutures total.  They were then brought through the sewing ring of he pericardial Carpentier-Edwards bioprosthesis, and the tissue valve was seated, nd the sutures were tied.  The valve was inspected and found to be well seated without obstruction of the coronary ostium.  The aortotomy was then closed as the patient was rewarmed, and a dose of warm retrograde bullet cardioplegia was given to clear air from the coronaries.  The usual de-airing maneuvers are also performed prior to closure of the aortotomy suture line.  The aortotomy was then tied as the aorta  cross-clamp was removed, and the aortic vent was on to suction.  The heart was cardioverted back to a regular rhythm.  The patient was rewarmed o 37 degrees.  Temporary pacing wires were applied.  The aortotomy suture line was inspected and the hemostatic.  When the patient was normothermic, the ventilator was turned back on, and the patient was weaned from cardiopulmonary bypass without difficulty with excellent hemodynamics and stable blood pressure.  Protamine was administered, and the cannulas were removed.  The mediastinum was irrigated with warm antibiotic irrigation.  The aortotomy suture line, cannulation sites, and pacing wire sites were all hemostatic.  The pericardium was loosely reapproximated. The sternum was reapproximated with eight interrupted sternal wires.   The pectoralis fascia and subcutaneous layers were closed with running Vicryl, and he skin was closed with a subcuticular Vicryl.  Sterile dressing was applied. Total  cardiopulmonary bypass time was 150 minutes with aorta cross-clamp time of 90 minutes. DD:  06/02/99 TD:  06/02/99 Job: [id number]

## 2010-09-04 NOTE — Consult Note (Signed)
[name], [name] NO.:  [id number]   MEDICAL RECORD NO.:  [id number]          PATIENT TYPE:  INP   LOCATION:  1431                         FACILITY:  [facility]   PHYSICIAN:  Lyn Records, M.D.   DATE OF BIRTH:  1928-02-01   DATE OF CONSULTATION:  02/02/2006  DATE OF DISCHARGE:                                   CONSULTATION   REASON FOR CONSULTATION:  Elevated blood pressure and premature beats.   CONCLUSIONS:  1. Premature ventricular contractions of uncertain significance.  2. Hypertension following knee surgery.  3. History of aortic valve replacement in late 1990s.      a.     Bowel prosthesis.  4. Total left knee replacement.   RECOMMENDATIONS:  1. Add low-dose beta blocker therapy in the form of metoprolol 25 mg      b.i.d.  2. Check BNP level and troponin I.  3. EKG in the a.m.  4. Will follow and make recommendations as required.   COMMENTS:  [name] is 77 years of age and underwent aortic valve  replacement of aortic stenosis in the left 1990s.  He tolerated the surgery  quite well and has done very well since then.  He received a bowel  prosthesis because he did not want to be on chronic Coumadin therapy.  He  was cleared for this orthopedic procedure and had no clinical difficulties  during the operation other than blood pressures noted to be near 200  systolic post surgery and PVC's noted post surgery.  The patient has no  prior history of malignant ventricular arrhythmias, and there is no personal  history of hypertension.   ALLERGIES:  None known.   MEDICATIONS:  1. Niacin 2 gm per day.  2. Vitamin C 500 mg per day.  3. Proscar 5 mg per day.  4. Aspirin 81 mg per day.  5. Osteo Bi-Flex daily.  6. Requip 0.5 mg per day.  7. Lorazepam 0.5 mg p.r.n.  8. Calcium with D daily.  9. Viagra p.r.n.   SIGNIFICANT MEDICAL PROBLEMS:  1. Osteoarthritis including significant left knee difficulties requiring      surgery.  2. Restless leg  syndrome.  3. Benign prostatic hypertrophy.  4. History of lumbar disk surgery.   FAMILY HISTORY:  Noncontributory.   SOCIAL HISTORY:  Does not smoke.  Has an occasional alcoholic beverage.   REVIEW OF SYSTEMS:  No current chest discomfort, shortness of breath or  specific cardiovascular complaints.   PHYSICAL EXAMINATION:  VITAL SIGNS:  The blood pressure is 180/80, heart  rate 80.  Monitor reveals sinus rhythm with PVC's occasionally on a  bigeminal pattern.  Postoperative EKG was not performed.  Preoperative EKG  did no reveal any acute change.  Some PVC's were noted preoperatively on the  January 26, 2006 EKG.  NECK:  No JVD.  LUNGS:  Clear to auscultation and percussion.  CARDIAC:  A grade 2/6 systolic murmur, right upper sternal border.  No  diastolic murmur is heard.  No gallop is heard.  ABDOMEN:  Soft.  Liver edge not  palpable.  EXTREMITIES:  No edema.   LABORATORY DATA:  Located on the patient's chart and included a hemoglobin  of 16 and BUN and creatinine of 20 and 1.2 with a potassium of 4.6.  On  January 26, 2006, calcium level was 9.8.  TSH was not performed.  Laboratory  data subsequent to surgery is unrevealing.   DISCUSSION:  The patient has done well since his aortic valve replacement.  He has no cardiopulmonary complaints at this time, but does have significant  elevation in blood pressure and PVC's, which, when I reviewed his  preoperative data, is not new.  We will recheck his electrolytes and make  sure that there is no occult heart failure by checking BNP bowel marker, and  also do a troponin I evaluation.  I will start low-dose beta blocker therapy  both for blood pressure control and to help with suppressing ventricular  arrhythmia.  We will also check a TSH to rule out hyperthyroidism.      Lyn Records, M.D.  Electronically Signed     HWS/MEDQ  D:  02/02/2006  T:  02/03/2006  Job:  [id number]   cc:   Theressa Millard, M.D.  Fax: [phone]    Georges Lynch. Darrelyn Hillock, M.D.  Fax: [phone]

## 2010-09-04 NOTE — H&P (Signed)
[name], [name]                 ACCOUNT NO.:  [id number]   MEDICAL RECORD NO.:  [id number]          PATIENT TYPE:  OIB   LOCATION:  1422                         FACILITY:  [facility]   PHYSICIAN:  Excell Seltzer. Annabell Howells, M.D.    DATE OF BIRTH:  24-Oct-1927   DATE OF ADMISSION:  06/27/2006  DATE OF DISCHARGE:                              HISTORY & PHYSICAL   HISTORY OF PRESENT ILLNESS:  [name] is a 77 year old, white male who  had the onset at 1 p.m. yesterday of severe left flank pain with nausea,  urgency, frequency and obstructive voiding symptoms.  He has had a prior  history of stones.  He had a CT scan today that revealed a 4 mm, left  distal ureteral stone.  He had moderate left hydronephrosis with a  forniceal rupture and perinephric urine.  It was felt that ureteroscopy  was indicated to relieve the obstruction.  The patient received morphine  10 mg and Phenergan 25 mg in our office with some relief of his pain.  He was brought to the operating room where a preoperative EKG  demonstrated a rate of 140 beats per minute with atrial flutter.  Anesthesia felt that this was not a safe level to proceed with the  procedure.  The patient was comfortable.  Dr. Peter Swaziland was contacted  and we are going to be admitting the patient for rate management and  will reschedule the ureteroscopy as needed.   ALLERGIES:  COUMADIN, PENICILLIN AND OXYTOCIN.   CURRENT MEDICATIONS:  1. Proscar 5 mg daily.  2. Requip 5 mg two daily.  3. Aspirin 81 mg daily.  4. Advil as needed.   PAST MEDICAL HISTORY:  1. Coronary artery disease with cardiac arrhythmia.  2. History of aortic valve replacement in 2001.  3. Arthritis.  4. Depression.  5. Anxiety.  6. Hypercholesterolemia.   PAST SURGICAL HISTORY:  In addition to the aortic valve replacement, a  lumbar fusion in 2006, and a knee replacement in 2007.   FAMILY HISTORY:  Pertinent for arthritis.   SOCIAL HISTORY:  Negative for tobacco.  Drinks two  alcoholic beverages  weekly and one to two caffeinated beverages.  He is retired.   REVIEW OF SYSTEMS:  He has had some night sweats and fatigue as well as  arthralgias.  He has had some depression and anxiety.   PHYSICAL EXAMINATION:  VITAL SIGNS:  His blood pressure was 161/77,  heart rate 140 and he is afebrile.  GENERAL:  He is a well-developed, well-nourished, white male who was in  acute distress in the office, but now appears to be pain-free.  He is  alert and oriented x3.  HEENT:  Normocephalic, atraumatic.  NECK:  Supple without thyromegaly.  LUNGS:  Clear.  Normal effort.  HEART:  Heart rate was somewhat tachycardiac.  ABDOMEN:  Soft, moderately obese with left CVA tenderness and left lower  quadrant tenderness.  No mass, hepatosplenomegaly or  hernias noted.  No  inguinal adenopathy is noted.  GENITAL:  Unremarkable phallus with an adequate meatus.  Scrotum is  unremarkable.  Testicles bilaterally descended, normal in size and  consistency without mass or tenderness.  Epididymides were unremarkable.  RECTAL:  Not performed as it was done this morning.  EXTREMITIES:  Full range of motion without edema.  NEUROLOGIC:  Grossly intact.  Skin is warm and dry.   IMPRESSION:  1. Left ureteral stone with hydronephrosis and pain.  2. Atrial flutter with a rapid rate.   PLAN:  He is going to be admitted to a telemetry bed and seen by Dr.  Swaziland in consultation for rate management.  Initially, will be given  Cardizem.  We will reschedule his ureteroscopy depending on the response  to cardiac therapy.      Excell Seltzer. Annabell Howells, M.D.  Electronically Signed     JJW/MEDQ  D:  06/27/2006  T:  06/28/2006  Job:  [id number]   cc:   Theressa Millard, M.D.  Fax: [phone]   Georgann Housekeeper, MD  Fax: [phone]   Lyn Records, M.D.  Fax: [phone]   Peter M. Swaziland, M.D.  Fax: [phone]

## 2010-09-04 NOTE — Discharge Summary (Signed)
[name], [name]                 ACCOUNT NO.:  [id number]   MEDICAL RECORD NO.:  [id number]          PATIENT TYPE:  INP   LOCATION:  1422                         FACILITY:  [facility]   PHYSICIAN:  Excell Seltzer. Annabell Howells, M.D.    DATE OF BIRTH:  06-Jul-1927   DATE OF ADMISSION:  06/27/2006  DATE OF DISCHARGE:  07/01/2006                               DISCHARGE SUMMARY   HISTORY:  Briefly, [name] is a 77 year old white male who presented  with acute colic and was found to have a left ureteral stone.  He was  brought in for planned left ureteroscopic stone extraction.   ALLERGIES/INTOLERANCES:  Pertinent for intolerance to COUMADIN,  PENICILLIN, and OXYCODONE.   ADMISSION MEDICATIONS:  1. Requip 25 mg twice daily.  2. Proscar 5 mg daily.  3. Aspirin 81 mg daily.  4. Calcium 1200 mg daily.  5. Osteo BiFlex twice daily,  6. Niacin 500 mg q.i.d.  7. Vitamin C 500 mg daily.  8. Advil p.r.n.   MEDICAL HISTORY:  Pertinent for tissue aortic valve replacement in 2001.  Left total knee replacement, history of hypertension, history of PVCs,  history of restless leg syndrome, history of lumbar disk surgery.  For  additional details of history and physical -- see the health history  assessment sheet and H&P.   ACCESSORY CLINICAL INFORMATION:  Initial hemoglobin 14.8, PT 16 with an  INR of 1.2.  Chemistries within normal limits with exception of glucose  minimally elevated at 100, a BUN 26, creatinine of 1.76, CK on the day  of admission was 303 with a CK/MB high at 7.94, and a troponin of 0.54.  An EKG done as part as preadmission testing revealed atrial flutter with  a rapid rate.   HOSPITAL COURSE:  When the patient was evaluated in the holding room for  his ureteroscopic procedure, he was found to have atrial flutter with a  rapid rate; and was not felt to be a satisfactory candidate for  anesthesia.  Dr. Peter Swaziland was consulted; and recommended that the  patient be admitted to telemetry to  rule out a myocardial infarction.  He was started IV Cardizem for rate control.  The initial, comments on  the consult were that the heparin would be held because of the patient's  need for impending surgery.  Later in the evening, the patient was found  to have elevated enzymes; and a heparin drip was initiated.  On March 11  the patient had some flank pain but no chest pain, blood pressure was  114/68, heart rate was down to 78.  It was felt that he could proceed  with surgery later in the evening.  He continued to have flank pain.  He  was taken to the operating room at 5:30 on March 11 and I was not aware  that he had been placed on heparin because the initial comments on the  note.  As a result his heparin had not been stopped prior the surgery;  so as opposed to ureteroscopy the patient underwent merely a left  ureteral stent insertion  which he tolerated well without complications.   He had persistent atrial flutter following procedure, although his rate  was better controlled with the Cardizem.  He had amiodarone initiated  orally as well as aspirin; and was maintained on IV heparin.  On March  12 he was having some spasms and pain with the every 15 minute voids.  His bladder was distended on exam; a 16-French Foley catheter was  inserted with return of 500 mL of bloody urine. His catheter was  irrigated as needed; and he was given Flomax.  Later that day he was  still having some spasms in the bladder, but the bladder was not  palpable.  The urine was bloody without clots and Levsin SL was added to  his medications.  On March 13 the patient's pain was better controlled.  The urine remained somewhat bloody.  His telemetry demonstrated frequent  ectopy.  On March 13 cardiology suggested stopping the heparin and  discontinuing the IV Cardizem.  It was also recommended that his Foley  catheter be removed, which was done.  On March 14 the patient was  voiding well without his catheter.   His urine was clear; he was having  some constipation complaints; and some left flank pain with voiding; but  was, in general, doing well.  He was afebrile.  He was felt to be ready  for discharge home with a final diagnosis of a left distal ureteral  stone.  His admission was complicated by atrial flutter with a rapid  rate and urinary retention secondary to BPH.  He was discharged home  with instructions to followup with me on March 18; and with Dr. Verdis Prime in 1-2 weeks.   DISCHARGE MEDICATIONS INCLUDE:  1. Flomax 0.4 mg daily.  2. Finasteride 5 mg daily.  3. Requip 2.5 mg twice daily.  4. Aspirin 81 mg daily.  5. Calcium 1200 mg daily.  6. Osteo BiFlex 1 twice daily.  7. Niacin 500 mg four times daily.  8. Meprozine 50/25 as needed.  9. Vitamin C 500 mg daily.  10.Amiodarone 200 mg twice daily.  11.Pyridium 200 mg t.i.d. p.r.n.  12.Colace 100 mg p.o. b.i.d.   DISPOSITION:  To home.   PROGNOSIS:  Fair.   CONDITION:  Improved.      Excell Seltzer. Annabell Howells, M.D.  Electronically Signed     JJW/MEDQ  D:  07/27/2006  T:  07/27/2006  Job:  [id number]   cc:   Lyn Records, M.D.  Fax: [phone]

## 2010-09-04 NOTE — Op Note (Signed)
[name], [name]                 ACCOUNT NO.:  [id number]   MEDICAL RECORD NO.:  [id number]          PATIENT TYPE:  INP   LOCATION:  0002                         FACILITY:  [facility]   PHYSICIAN:  Georges Lynch. Gioffre, M.D.DATE OF BIRTH:  09-25-1927   DATE OF PROCEDURE:  02/02/2006  DATE OF DISCHARGE:                                 OPERATIVE REPORT   SURGEON:  Georges Lynch. Darrelyn Hillock, M.D.   ASSISTANT:  Arlyn Leak, PA   PREOPERATIVE DIAGNOSIS:  Severe degenerative arthritis of the left knee.   POSTOPERATIVE DIAGNOSIS:  Severe degenerative arthritis of the left knee.   PROSTHESIS USED:  DePuy rotating platform type prosthesis.  The sizes used  was the femoral component was a size 4 posterior cruciate sacrificing type,  the tibial tray was a size 4, the patella was a size 38 mm 3 PEG patella.  The insert was a size 4.  The rotating platform insert was a size 4, 10 mm  thickness insert.   PROCEDURE:  Under general anesthesia, routine orthopedic prep and drape of  the left lower extremity carried out.  He had 1 gram of IV Ancef.  At this  time the leg was exsanguinated Esmarch, tourniquet was elevated 350 mmHg.  A  midline incision was made over the anterior aspect left knee, two flaps were  created and then I carried out a median parapatellar incision reflecting  patella laterally, flexed the knee and did medial and lateral meniscectomies  and excised the anterior and posterior cruciate ligaments.  I then removed  all the spurs and from the femur and tibia and patella and also did a  synovectomy.  I thoroughly irrigated out the area, initial drill holes made  in the intercondylar notch.  The guide rod was inserted up the femoral  canal, #1 jig was inserted.  We removed 10 mm thickness off the distal  femur.  At this time a #2 jig was inserted.  After taking appropriate  measurements for a size 4 left femur, we carried our anterior posterior  chamfer cuts in the usual fashion.  We then  prepared the tibia for a size 4  tibia.  We made our notch cut in the usual fashion.  Following that, after  thoroughly prepared the tibia, we then made our notch cut in the distal  femur, then we went through and utilized our spacers and felt at this time  we had to go back and two more mm thickness off the affected lateral side of  the tibia.  We took a total 6 mm of the affected lateral side.  Thoroughly  irrigated out the area.  We then measured our patella in the usual fashion  for a resurfacing procedure.  We took the appropriate amount of the  articular surface of the patella off with the oscillating saw.  We then  utilized a 38 patella with three drill holes.  Once the bone was all  prepared we thoroughly irrigated out the area, dried the area out and then  went on and cemented all three components in simultaneously after we  went  through the trials again.  Following that, we removed all loose pieces of  cement and then finally elected to use a 10 mm thickness size 4 rotating  platform insert.  We then reduced the knee, took the knee through motion and  had excellent stability, excellent motion.  I injected local quarter percent  Sensorcaine plus 30 mg of Toradol into the local site for anesthesia  purposes and then utilized a thrombin spray for clotting purposes.  Hemovac  drain was inserted, knee was closed in layers usual fashion.  Skin was  closed with metal staples.  Sterile Neosporin dressing was applied.          ______________________________  Georges Lynch Darrelyn Hillock, M.D.    RAG/MEDQ  D:  02/02/2006  T:  02/03/2006  Job:  [id number]

## 2010-09-07 ENCOUNTER — Ambulatory Visit: Payer: Medicare Other | Admitting: Physical Therapy

## 2010-09-09 ENCOUNTER — Ambulatory Visit: Payer: Medicare Other | Admitting: Rehabilitation

## 2010-09-10 ENCOUNTER — Ambulatory Visit: Payer: Medicare Other | Admitting: Physical Therapy

## 2010-09-15 ENCOUNTER — Ambulatory Visit: Payer: Medicare Other | Admitting: Physical Therapy

## 2010-09-16 ENCOUNTER — Ambulatory Visit: Payer: Medicare Other | Admitting: Physical Therapy

## 2010-09-17 ENCOUNTER — Encounter: Payer: Medicare Other | Admitting: Rehabilitative and Restorative Service Providers"

## 2011-01-08 LAB — DIFFERENTIAL
Basophils Absolute: 0
Eosinophils Relative: 5
Lymphocytes Relative: 21
Lymphs Abs: 1.5
Monocytes Absolute: 0.6
Neutro Abs: 4.8

## 2011-01-08 LAB — CBC
HCT: 43.1
Hemoglobin: 15
RBC: 4.67
RDW: 13.4
WBC: 7.4

## 2011-01-08 LAB — BASIC METABOLIC PANEL
Calcium: 9.5
GFR calc Af Amer: >60
GFR calc non Af Amer: >60
Glucose, Bld: 85
Potassium: 5.4 — ABNORMAL HIGH
Sodium: 138

## 2011-01-08 LAB — TYPE AND SCREEN
ABO/RH(D): A NEG
Antibody Screen: NEGATIVE

## 2011-01-08 LAB — ABO/RH: ABO/RH(D): A NEG

## 2011-01-08 LAB — APTT: aPTT: 31

## 2011-01-11 LAB — CBC
HCT: 35.7 — ABNORMAL LOW
MCV: 93.1
Platelets: 243
RDW: 13.7

## 2011-01-20 LAB — BASIC METABOLIC PANEL
BUN: 18
Calcium: 9.2
Creatinine, Ser: 1.16
GFR calc non Af Amer: >60
Glucose, Bld: 94

## 2011-01-20 LAB — URINALYSIS, ROUTINE W REFLEX MICROSCOPIC
Bilirubin Urine: NEGATIVE
Ketones, ur: NEGATIVE
Nitrite: NEGATIVE
Specific Gravity, Urine: 1.012
Urobilinogen, UA: 0.2

## 2011-01-20 LAB — CBC
Platelets: 216
RDW: 13
WBC: 7.7

## 2011-01-27 ENCOUNTER — Other Ambulatory Visit: Payer: Self-pay | Admitting: Dermatology

## 2011-02-23 ENCOUNTER — Encounter (HOSPITAL_COMMUNITY): Payer: Self-pay

## 2011-02-23 ENCOUNTER — Encounter (HOSPITAL_COMMUNITY): Payer: Medicare Other

## 2011-02-23 LAB — BASIC METABOLIC PANEL
BUN: 23 mg/dL (ref 6–23)
Calcium: 8.9 mg/dL (ref 8.4–10.5)
Creatinine, Ser: 1.18 mg/dL (ref 0.50–1.35)
GFR calc Af Amer: 65 mL/min — ABNORMAL LOW (ref >90)
GFR calc non Af Amer: 56 mL/min — ABNORMAL LOW (ref >90)

## 2011-02-23 LAB — CBC
HCT: 41.8 % (ref 39.0–52.0)
MCH: 31.7 pg (ref 26.0–34.0)
MCHC: 33.5 g/dL (ref 30.0–36.0)
MCV: 94.8 fL (ref 78.0–100.0)
Platelets: 177 10*3/uL (ref 150–400)
RDW: 13.4 % (ref 11.5–15.5)
WBC: 7.2 10*3/uL (ref 4.0–10.5)

## 2011-02-23 NOTE — Patient Instructions (Signed)
20 EMMA SCHUPP  02/23/2011   Your procedure is scheduled on: 03-02-2011 report to [facility] at 0530 AM.  Call this number if you have problems the morning of surgery: [phone]   Remember:   Do not eat food:After Midnight.  Do not drink clear liquids: After Midnight.  Take these medicines the morning of surgery with A SIP OF WATER: Dilantin,Levothyroxine,Metoprolol,Amiodarone  Do not wear jewelry, make-up or nail polish.  Do not wear lotions, powders, or perfumes.t.  Do not shave 48 hours prior to surgery.  Do not bring valuables to the hospital.  Contacts, dentures or bridgework may not be worn into surgery.  Leave suitcase in the car. After surgery it may be brought to your room.  For patients admitted to the hospital, checkout time is 11:00 AM the day of discharge.   Patients discharged the day of surgery will not be allowed to drive home.  Name and phone number of your driver:   Special Instructions: CHG Shower Use Special Wash: 1/2 bottle night before surgery and 1/2 bottle morning of surgery.   Please read over the following fact sheets that you were given: MRSA Information

## 2011-02-23 NOTE — Pre-Procedure Instructions (Signed)
02/10/2011-clearance note from Dr. Katrinka Blazing on chart,12/07/2010-chest x-ray [place] on chart, 09/06/2010 EKG-[facility] on chart

## 2011-03-01 MED ORDER — VANCOMYCIN HCL 1000 MG IV SOLR
1500.0000 mg | Freq: Once | INTRAVENOUS | Status: AC
  Administered 2011-03-02: 1000 mg via INTRAVENOUS
  Filled 2011-03-01: qty 1500

## 2011-03-01 NOTE — H&P (Signed)
[name] is an 77 y.o. male.   Chief Complaint: Voiding difficulty. HPI: [name] returns today for TURP.  He has BPH with BOO with a prior history of postop retention.    He remains on finasteride but is not on Tamsulosin.  He has a variably reduced stream with an occasional sensation of imcomplete emptying.  He has nocturia x 3 on a regular schedule.  He has some intermittancy but minimal urgency.  He denies hematuria or dysuria.  His IPSS today is 16 and his PVR is 12cc.   He was evaluated and found not to be a candidate for cooled thermotherapy but a better candidate for a TURP or similar procedure.   Past Medical History  Diagnosis Date  . Hypothyroidism     takes Levothyroxine  . Coronary artery disease     valve replaced  . Sleep apnea 2011    does not use CPAP  . Seizures     cannot remember last seizure  . Arthritis     all over body  . Anxiety     comes and goes  . Depression     comes and goes  . Dysrhythmia     recurrent arrhythmia w/AV replacement  . CHF (congestive heart failure) 02/10/11 note    has compensated CHF per Dr. Katrinka Blazing note  . Constipation 08/2009    after surgery  . Urinary tract bacterial infections 08/2010    from surgery and catheters    Past Surgical History  Procedure Date  . Cardiac valve replacement 2001  . Back surgery 2011-last one    6 surgeries  . Cardiac catheterization 2001    before valve replacement  . Joint replacement 08/2010    right knee  . Joint replacement 2004    left knee    No family history on file. Social History:  reports that he quit smoking about 27 years ago. His smoking use included Cigarettes. He has a 30 pack-year smoking history. He does not have any smokeless tobacco history on file. He reports that he does not drink alcohol or use illicit drugs.  Allergies:  Allergies  Allergen Reactions  . Penicillins Other (See Comments)    Pt doesn't remember the reaction  . Warfarin And Related Other (See  Comments)    Pt gets the shakes    No current facility-administered medications on file as of .   Medications Prior to Admission  Medication Sig Dispense Refill  . acetaminophen (TYLENOL) 500 MG tablet Take 500 mg by mouth every 6 (six) hours as needed. pain       . amiodarone (PACERONE) 200 MG tablet Take 100 mg by mouth daily. Pt takes (0.5 tab) for 100mg  dosage      . Ascorbic Acid (VITAMIN C) 1000 MG tablet Take 1,000 mg by mouth every morning.       . dabigatran (PRADAXA) 150 MG CAPS Take 150 mg by mouth every 12 (twelve) hours.       . docusate sodium (COLACE) 100 MG capsule Take 200 mg by mouth every morning.       . finasteride (PROSCAR) 5 MG tablet Take 5 mg by mouth every morning.       Marland Kitchen HYDROcodone-acetaminophen (NORCO) 5-325 MG per tablet Take 1 tablet by mouth every 6 (six) hours as needed. pain      . levothyroxine (SYNTHROID, LEVOTHROID) 50 MCG tablet Take 50 mcg by mouth every morning.       . metoprolol (  TOPROL-XL) 50 MG 24 hr tablet Take 50 mg by mouth every morning.       . Misc Natural Products (OSTEO BI-FLEX ADV DOUBLE ST PO) Take 1 tablet by mouth 2 (two) times daily.       . niacin (NIASPAN) 1000 MG CR tablet Take 1,000 mg by mouth at bedtime.       . niacin 500 MG tablet Take 500 mg by mouth 2 (two) times daily with a meal.       . phenytoin (DILANTIN) 100 MG ER capsule Take 200 mg by mouth 2 (two) times daily.       Marland Kitchen rOPINIRole (REQUIP) 0.5 MG tablet Take 0.5 mg by mouth 3 (three) times daily.         No results found for this or any previous visit (from the past 48 hour(s)). No results found.  Review of Systems  Constitutional: Negative.   Respiratory: Negative.   Cardiovascular: Negative.   Genitourinary:       He  Has obstructive and irritative voiding symptoms.    There were no vitals taken for this visit. Physical Exam  Constitutional: He appears well-developed and well-nourished.  HENT:  Head: Normocephalic and atraumatic.  Cardiovascular:  Normal rate and regular rhythm.   Respiratory: Effort normal.  GI: Soft.     Assessment/Plan BPH with BOO.  He will undergo endoscopy under anesthesia and will have a TURP or TUEVP depending on his anatomy.   The risks and expectations were reviewed and are documented in his office chart.  Shoshanah Dapper J 03/01/2011, 8:44 AM

## 2011-03-02 ENCOUNTER — Encounter (HOSPITAL_COMMUNITY): Payer: Self-pay | Admitting: Urology

## 2011-03-02 ENCOUNTER — Encounter (HOSPITAL_COMMUNITY)
Admission: RE | Disposition: A | Discharge status: Home / Self Care | Payer: Self-pay | Source: Ambulatory Visit | Attending: Urology

## 2011-03-02 ENCOUNTER — Encounter (HOSPITAL_COMMUNITY): Payer: Self-pay | Admitting: *Deleted

## 2011-03-02 ENCOUNTER — Ambulatory Visit (HOSPITAL_COMMUNITY): Payer: Medicare Other | Admitting: *Deleted

## 2011-03-02 ENCOUNTER — Other Ambulatory Visit: Payer: Self-pay | Admitting: Urology

## 2011-03-02 ENCOUNTER — Observation Stay (HOSPITAL_COMMUNITY)
Admission: RE | Admit: 2011-03-02 | Discharge: 2011-03-03 | Disposition: A | Discharge status: Home / Self Care | Payer: Medicare Other | Source: Ambulatory Visit | Attending: Urology | Admitting: Urology

## 2011-03-02 DIAGNOSIS — N401 Enlarged prostate with lower urinary tract symptoms: Secondary | ICD-10-CM

## 2011-03-02 DIAGNOSIS — Z96659 Presence of unspecified artificial knee joint: Secondary | ICD-10-CM | POA: Insufficient documentation

## 2011-03-02 DIAGNOSIS — N329 Bladder disorder, unspecified: Secondary | ICD-10-CM | POA: Insufficient documentation

## 2011-03-02 DIAGNOSIS — N138 Other obstructive and reflux uropathy: Principal | ICD-10-CM | POA: Insufficient documentation

## 2011-03-02 DIAGNOSIS — G4730 Sleep apnea, unspecified: Secondary | ICD-10-CM | POA: Insufficient documentation

## 2011-03-02 DIAGNOSIS — Z01812 Encounter for preprocedural laboratory examination: Secondary | ICD-10-CM | POA: Insufficient documentation

## 2011-03-02 DIAGNOSIS — I2510 Atherosclerotic heart disease of native coronary artery without angina pectoris: Secondary | ICD-10-CM | POA: Insufficient documentation

## 2011-03-02 DIAGNOSIS — I509 Heart failure, unspecified: Secondary | ICD-10-CM | POA: Insufficient documentation

## 2011-03-02 DIAGNOSIS — Z79899 Other long term (current) drug therapy: Secondary | ICD-10-CM | POA: Insufficient documentation

## 2011-03-02 DIAGNOSIS — N320 Bladder-neck obstruction: Secondary | ICD-10-CM | POA: Insufficient documentation

## 2011-03-02 DIAGNOSIS — E039 Hypothyroidism, unspecified: Secondary | ICD-10-CM | POA: Insufficient documentation

## 2011-03-02 HISTORY — PX: BIOPSY: SHX5522

## 2011-03-02 HISTORY — PX: TRANSURETHRAL INCISION OF PROSTATE: SHX2573

## 2011-03-02 SURGERY — INCISION, PROSTATE, TRANSURETHRAL
Anesthesia: Choice | Site: Penis | Wound class: Clean Contaminated

## 2011-03-02 MED ORDER — GENTAMICIN SULFATE 40 MG/ML IJ SOLN
120.0000 mg | Freq: Once | INTRAVENOUS | Status: DC
  Filled 2011-03-02: qty 3

## 2011-03-02 MED ORDER — LACTATED RINGERS IV SOLN
INTRAVENOUS | Status: DC | PRN
  Administered 2011-03-02 (×2): via INTRAVENOUS

## 2011-03-02 MED ORDER — POLYETHYLENE GLYCOL 3350 17 G PO PACK
17.0000 g | PACK | Freq: Every day | ORAL | Status: DC | PRN
  Filled 2011-03-02: qty 1

## 2011-03-02 MED ORDER — ACETAMINOPHEN 325 MG PO TABS: 650.0000 mg | ORAL_TABLET | ORAL | Status: DC | PRN

## 2011-03-02 MED ORDER — PHENYTOIN SODIUM EXTENDED 100 MG PO CAPS
200.0000 mg | ORAL_CAPSULE | Freq: Two times a day (BID) | ORAL | Status: DC
  Administered 2011-03-02 – 2011-03-03 (×2): 200 mg via ORAL
  Filled 2011-03-02 (×4): qty 2

## 2011-03-02 MED ORDER — DOCUSATE SODIUM 100 MG PO CAPS
200.0000 mg | ORAL_CAPSULE | Freq: Once | ORAL | Status: AC
  Administered 2011-03-02: 200 mg via ORAL
  Filled 2011-03-02: qty 2

## 2011-03-02 MED ORDER — POTASSIUM CHLORIDE IN NACL 20-0.45 MEQ/L-% IV SOLN
INTRAVENOUS | Status: DC
  Administered 2011-03-02: 13:00:00 via INTRAVENOUS
  Filled 2011-03-02 (×4): qty 1000

## 2011-03-02 MED ORDER — DOCUSATE SODIUM 100 MG PO CAPS
200.0000 mg | ORAL_CAPSULE | ORAL | Status: DC
  Filled 2011-03-02 (×2): qty 2

## 2011-03-02 MED ORDER — LIDOCAINE HCL (CARDIAC) 20 MG/ML IV SOLN
INTRAVENOUS | Status: DC | PRN
  Administered 2011-03-02: 80 mg via INTRAVENOUS

## 2011-03-02 MED ORDER — LEVOTHYROXINE SODIUM 50 MCG PO TABS
50.0000 ug | ORAL_TABLET | ORAL | Status: DC
  Administered 2011-03-03: 50 ug via ORAL
  Filled 2011-03-02 (×2): qty 1

## 2011-03-02 MED ORDER — FENTANYL CITRATE 0.05 MG/ML IJ SOLN
INTRAMUSCULAR | Status: DC | PRN
  Administered 2011-03-02 (×2): 50 ug via INTRAVENOUS

## 2011-03-02 MED ORDER — ONDANSETRON HCL 4 MG/2ML IJ SOLN: 4.0000 mg | INTRAMUSCULAR | Status: DC | PRN

## 2011-03-02 MED ORDER — PROMETHAZINE HCL 25 MG/ML IJ SOLN: 6.2500 mg | INTRAMUSCULAR | Status: DC | PRN

## 2011-03-02 MED ORDER — SODIUM CHLORIDE 0.9 % IR SOLN
3000.0000 mL | Status: DC
  Administered 2011-03-02: 3000 mL

## 2011-03-02 MED ORDER — ZOLPIDEM TARTRATE 5 MG PO TABS
5.0000 mg | ORAL_TABLET | Freq: Every evening | ORAL | Status: DC | PRN
  Administered 2011-03-02: 5 mg via ORAL
  Filled 2011-03-02: qty 1

## 2011-03-02 MED ORDER — HYOSCYAMINE SULFATE 0.125 MG SL SUBL
0.1250 mg | SUBLINGUAL_TABLET | SUBLINGUAL | Status: DC | PRN
  Filled 2011-03-02: qty 1

## 2011-03-02 MED ORDER — HYDROCODONE-ACETAMINOPHEN 5-325 MG PO TABS: 1.0000 | ORAL_TABLET | Freq: Four times a day (QID) | ORAL | Status: DC | PRN

## 2011-03-02 MED ORDER — MORPHINE SULFATE 2 MG/ML IJ SOLN: 2.0000 mg | INTRAMUSCULAR | Status: DC | PRN

## 2011-03-02 MED ORDER — HYDROMORPHONE HCL PF 1 MG/ML IJ SOLN
0.2500 mg | INTRAMUSCULAR | Status: DC | PRN
  Administered 2011-03-02 (×4): 0.25 mg via INTRAVENOUS

## 2011-03-02 MED ORDER — GENTAMICIN SULFATE 40 MG/ML IJ SOLN
INTRAVENOUS | Status: DC | PRN
  Administered 2011-03-02: 120 mL via INTRAVENOUS

## 2011-03-02 MED ORDER — ROPINIROLE HCL 0.5 MG PO TABS
0.5000 mg | ORAL_TABLET | Freq: Three times a day (TID) | ORAL | Status: DC
  Administered 2011-03-02 – 2011-03-03 (×3): 0.5 mg via ORAL
  Filled 2011-03-02 (×7): qty 1

## 2011-03-02 MED ORDER — PROPOFOL 10 MG/ML IV EMUL
INTRAVENOUS | Status: DC | PRN
  Administered 2011-03-02: 160 mg via INTRAVENOUS

## 2011-03-02 MED ORDER — METOPROLOL SUCCINATE ER 50 MG PO TB24
50.0000 mg | ORAL_TABLET | ORAL | Status: DC
  Administered 2011-03-03: 50 mg via ORAL
  Filled 2011-03-02 (×2): qty 1

## 2011-03-02 MED ORDER — AMIODARONE HCL 100 MG PO TABS
100.0000 mg | ORAL_TABLET | Freq: Every day | ORAL | Status: DC
  Administered 2011-03-03: 100 mg via ORAL
  Filled 2011-03-02 (×2): qty 1

## 2011-03-02 MED ORDER — SODIUM CHLORIDE 0.9 % IR SOLN
Status: DC | PRN
  Administered 2011-03-02: 3000 mL

## 2011-03-02 SURGICAL SUPPLY — 26 items
BAG URINE DRAINAGE (UROLOGICAL SUPPLIES) ×1 IMPLANT
BAG URO CATCHER STRL LF (DRAPE) ×3 IMPLANT
BLADE SURG 15 STRL LF DISP TIS (BLADE) IMPLANT
BLADE SURG 15 STRL SS (BLADE)
CATH FOLEY 3WAY 30CC 22FR (CATHETERS) ×1 IMPLANT
CLOTH BEACON ORANGE TIMEOUT ST (SAFETY) ×3 IMPLANT
DRAPE CAMERA CLOSED 9X96 (DRAPES) ×3 IMPLANT
ELECT REM PT RETURN 9FT ADLT (ELECTROSURGICAL) ×3
ELECTRODE KNIFE URO 27FR PED (UROLOGICAL SUPPLIES) ×3 IMPLANT
ELECTRODE REM PT RTRN 9FT ADLT (ELECTROSURGICAL) ×2 IMPLANT
EVACUATOR MICROVAS BLADDER (UROLOGICAL SUPPLIES) ×2 IMPLANT
GLOVE SURG SS PI 8.0 STRL IVOR (GLOVE) ×3 IMPLANT
GOWN PREVENTION PLUS XLARGE (GOWN DISPOSABLE) ×3 IMPLANT
GOWN STRL REIN XL XLG (GOWN DISPOSABLE) ×3 IMPLANT
HOLDER FOLEY CATH W/STRAP (MISCELLANEOUS) ×1 IMPLANT
KIT ASPIRATION TUBING (SET/KITS/TRAYS/PACK) ×3 IMPLANT
KNIFE COLLINS 24FR (ELECTROSURGICAL) IMPLANT
LOOPS CUTTING 24FR (ELECTROSURGICAL) IMPLANT
LOOPS RESECTOSCOPE DISP (ELECTROSURGICAL) ×3 IMPLANT
MANIFOLD NEPTUNE II (INSTRUMENTS) ×3 IMPLANT
PACK CYSTO (CUSTOM PROCEDURE TRAY) ×3 IMPLANT
ROLLER BALL 3MM 27FR (ELECTROSURGICAL) IMPLANT
SUT ETHILON 3 0 PS 1 (SUTURE) IMPLANT
SYR 30ML LL (SYRINGE) ×1 IMPLANT
SYRINGE IRR TOOMEY STRL 70CC (SYRINGE) ×1 IMPLANT
TUBING CONNECTING 10 (TUBING) ×3 IMPLANT

## 2011-03-02 NOTE — Progress Notes (Signed)
  [name] is doing well post TURP with slightly pink urine on the CBI.  He has no complaints.

## 2011-03-02 NOTE — Interval H&P Note (Signed)
History and Physical Interval Note:   03/02/2011   7:21 AM   [name]  has presented today for surgery, with the diagnosis of Benign Prostatic Hypertrophy with both obstructive and irritative symptoms  The various methods of treatment have been discussed with the patient and family. After consideration of risks, benefits and other options for treatment, the patient has consented to  Procedure(s): TRANSURETHRAL INCISION OF THE PROSTATE (TUIP) as a surgical intervention .  The patients' history has been reviewed, patient examined, no change in status, stable for surgery.  I have reviewed the patients' chart and labs.  Questions were answered to the patient's satisfaction.     [name]  MD

## 2011-03-02 NOTE — Brief Op Note (Signed)
03/02/2011  8:11 AM  PATIENT:  [name]  77 y.o. male  PRE-OPERATIVE DIAGNOSIS:  Benign Prostatic Hypertrophy with both obstructive and irritative symptoms  POST-OPERATIVE DIAGNOSIS:  BPH with BOO and bladder wall lesion.  PROCEDURE:  Procedure(s): TRANSURETHRAL URETHRAL RESECTION of the PROSTATE (TURP) and BLADDER BIOPSY   SURGEON:  Surgeon(s): [name]  PHYSICIAN ASSISTANT:   ASSISTANTS: none   ANESTHESIA:   general  EBL:  Total I/O In: 1000 [I.V.:1000] Out: -   BLOOD ADMINISTERED:none  DRAINS: Urinary Catheter (Foley)   LOCAL MEDICATIONS USED:  NONE  SPECIMEN:  Source of Specimen:  Prostate Chips and Biopsy from right bladder dome.  DISPOSITION OF SPECIMEN:  PATHOLOGY  COUNTS:  YES  TOURNIQUET:  * No tourniquets in log *  DICTATION: .Other Dictation: Dictation Number [id number]  PLAN OF CARE: Admit for overnight observation  PATIENT DISPOSITION:  PACU - hemodynamically stable.   Delay start of Pharmacological VTE agent (>24hrs) due to surgical blood loss or risk of bleeding:  {YES/NO/NOT APPLICABLE:20182

## 2011-03-02 NOTE — Anesthesia Postprocedure Evaluation (Signed)
  Anesthesia Post-op Note  Patient: [name]  Procedure(s) Performed:  TRANSURETHRAL INCISION OF THE PROSTATE (TUIP); BIOPSY  Patient Location: PACU  Anesthesia Type: General  Level of Consciousness: oriented and sedated  Airway and Oxygen Therapy: Patient Spontanous Breathing and Patient connected to nasal cannula oxygen  Post-op Pain: mild  Post-op Assessment: Post-op Vital signs reviewed, Patient's Cardiovascular Status Stable, Respiratory Function Stable and Patent Airway  Post-op Vital Signs: stable  Complications: No apparent anesthesia complications

## 2011-03-02 NOTE — Anesthesia Preprocedure Evaluation (Addendum)
Anesthesia Evaluation  Patient identified by MRN, date of birth, ID band Patient awake    Reviewed: Allergy & Precautions, H&P , NPO status , Patient's Chart, lab work & pertinent test results, reviewed documented beta blocker date and time   Airway Mallampati: II TM Distance: >3 FB Neck ROM: Full    Dental   Pulmonary sleep apnea ,  Non-compliant w/ CPAP  clear to auscultation        Cardiovascular hypertension, Pt. on medications Regular Normal Atrial Fib AVR 2001, SBE prophalaxis Denies hx of CAD   Neuro/Psych Seizures -, Well Controlled,  Negative Neurological ROS  Negative Psych ROS   GI/Hepatic negative GI ROS, Neg liver ROS,   Endo/Other  Hypothyroidism Thyroid replacement  Renal/GU negative Renal ROS   BPH    Musculoskeletal negative musculoskeletal ROS (+)   Abdominal   Peds negative pediatric ROS (+)  Hematology negative hematology ROS (+)   Anesthesia Other Findings   Reproductive/Obstetrics negative OB ROS                           Anesthesia Physical Anesthesia Plan  ASA: III  Anesthesia Plan: General   Post-op Pain Management:    Induction: Intravenous  Airway Management Planned: LMA  Additional Equipment:   Intra-op Plan:   Post-operative Plan: Extubation in OR  Informed Consent: I have reviewed the patients History and Physical, chart, labs and discussed the procedure including the risks, benefits and alternatives for the proposed anesthesia with the patient or authorized representative who has indicated his/her understanding and acceptance.     Plan Discussed with: CRNA and Surgeon  Anesthesia Plan Comments: (Discussed regional, pt has had multiple spinal surgeries)       Anesthesia Quick Evaluation

## 2011-03-02 NOTE — Transfer of Care (Signed)
Immediate Anesthesia Transfer of Care Note  Patient: [name]  Procedure(s) Performed:  TRANSURETHRAL INCISION OF THE PROSTATE (TUIP); BIOPSY  Patient Location: PACU  Anesthesia Type: General  Level of Consciousness: awake and alert   Airway & Oxygen Therapy: Patient Spontanous Breathing  Post-op Assessment: Report given to PACU RN  Post vital signs: Reviewed and stable  Complications: No apparent anesthesia complications

## 2011-03-02 NOTE — Op Note (Signed)
[name], [name] NO.:  [id number]  MEDICAL RECORD NO.:  [id number]  LOCATION:                               FACILITY:  [facility]  PHYSICIAN:  Excell Seltzer. Annabell Howells, M.D.    DATE OF BIRTH:  1928-03-06  DATE OF PROCEDURE:  03/01/2011 DATE OF DISCHARGE:                              OPERATIVE REPORT   PROCEDURE:  Transurethral resection of prostate and bladder wall biopsy.  PREOPERATIVE DIAGNOSIS:  Benign prostate hypertrophy with bladder outlet obstruction.  POSTOPERATIVE DIAGNOSES:  Benign prostate hypertrophy with bladder outlet obstruction, right anterior bladder wall lesion.  SURGEON:  Excell Seltzer. Annabell Howells, M.D.  ANESTHESIA:  General.  SPECIMEN:  Bladder wall biopsy and prostate chips sent to pathology.  DRAINS:  24-French 3-way Foley catheter.  BLOOD LOSS:  100 cc.  COMPLICATIONS:  None.  INDICATIONS:  [name] is an 77 year old white male with long history of BPH and bladder outlet of obstruction, who has had progressive voiding symptoms on medical therapy with finasteride.  He has had prior retention.  After reviewing the options, we elected to proceed with a TURP.  FINDINGS OF PROCEDURE:  He was taken to operating room where he was given vancomycin and gentamicin per pharmacy protocol.  A general anesthetic was induced.  He was placed in lithotomy position.  His perineum and genitalia were prepped with Betadine solution and he was draped in usual sterile fashion.  Time-out was performed.  Cystoscopy was performed using a 22-French scope and 12 degree lens. Examination revealed a normal urethra.  The external sphincter was intact.  The prostatic urethra was approximately 3 cm in length with bilobar hyperplasia with obstruction and a high bladder neck. Examination of bladder revealed a mild to moderate trabeculation.  No tumors or stones were seen, but there was a small erythematous lesion about 3 mm in size in the right anterior bladder wall.  It was felt  to require biopsy.  Ureteral orifices were unremarkable in their normal anatomic position.  After initial cystoscopic inspection, a cup biopsy forceps was used to biopsy the lesion on the right anterior bladder wall and this was sent to pathology.  The urethra was then calibrated to 30-French with Sissy Hoff sounds and a 28-French continuous flow resectoscope sheath was inserted.  This was fitted with an [place] handle, a Gyrus loop, and 12 degree lens.  The bladder wall lesion was then fulgurated for hemostasis, and then I turned my attention to the TURP.  After assessing the prostate anatomy, it was felt that formal TURP was more appropriate than a TURP in this particular patient.  Resection was initiated at the bladder neck exposing the fibers from 5 to 7 o'clock before.  The prostate was then resected out alongside the verumontanum. The right lobe of the prostate was resected from bladder neck to apex out to the capsular fibers, followed by resection of the left lobe from bladder neck to apex out to the capsular fibers.  At this point, hemostasis was achieved and the chips were removed.  Some residual apical and anterior tissue was then resected to complete the procedure.  These chips were removed.  Final hemostasis was  achieved.  The bladder was left full and the scope was removed after ensuring there were no retained chips, active bleeding, or injury to the bladder or ureteral orifices.  Upon removal of the scope, pressure on the bladder produced an excellent stream.  A 22-French 3-way Foley catheter was inserted with the aid of a catheter guide.  The balloon was filled with 30 cc of sterile fluid.  The catheter was then hand irrigated with clear return and connected to straight drainage and continuous irrigation with normal saline.  The patient was taken down from lithotomy position.  His anesthetic was reversed.  He was moved to recovery room in stable condition.   There were no complications.     Excell Seltzer. Annabell Howells, M.D.     JJW/MEDQ  D:  03/02/2011  T:  03/02/2011  Job:  [id number]  cc:   Theressa Millard, M.D. Fax: [phone]  Lyn Records, M.D. Fax: [phone]

## 2011-03-03 NOTE — Discharge Summary (Signed)
Physician Discharge Summary  Patient ID: [name] MRN: [id number] DOB/AGE: 1928/03/27 77 y.o.  Admit date: 03/02/2011 Discharge date: 03/03/2011  Admission Diagnoses:BPH with BOO  Discharge Diagnoses:BPH with BOO  Active Problems:  * No active hospital problems. *    Discharged Condition: good  Hospital Course: Pt had a TURP on 11/12 and had an uneventful post op course.   His foley  Was removed this morning and he will go home when voiding.  Consults: none  Significant Diagnostic Studies: None  Treatments: surgery: TURP  Discharge Exam: Blood pressure 117/72, pulse 61, temperature 98.3 F (36.8 C), temperature source Oral, resp. rate 16, height 5\' 6"  (1.676 m), weight 78.291 kg (172 lb 9.6 oz), SpO2 97.00%. General appearance: alert and no distress Male genitalia: normal  Disposition: Home or Self Care  Discharge Orders    Future Orders Please Complete By Expires   Diet - low sodium heart healthy      Increase activity slowly      Driving Restrictions      Comments:   1week   Lifting restrictions      Comments:   No more that 5 lbs for 2 weeks.   Sexual Activity Restrictions      Comments:   3 weeks   Discharge instructions      Comments:   Call for fever >101, heavy bleeding or voiding difficulty.   Resume pradaxa in 1 week unless the urine is blood and then hold until clear.   Discontinue IV        Current Discharge Medication List    CONTINUE these medications which have NOT CHANGED   Details  acetaminophen (TYLENOL) 500 MG tablet Take 500 mg by mouth every 6 (six) hours as needed. pain     amiodarone (PACERONE) 200 MG tablet Take 100 mg by mouth daily. Pt takes (0.5 tab) for 100mg  dosage    Ascorbic Acid (VITAMIN C) 1000 MG tablet Take 1,000 mg by mouth every morning.     dabigatran (PRADAXA) 150 MG CAPS Take 150 mg by mouth every 12 (twelve) hours.     docusate sodium (COLACE) 100 MG capsule Take 200 mg by mouth every morning.       HYDROcodone-acetaminophen (NORCO) 5-325 MG per tablet Take 1 tablet by mouth every 6 (six) hours as needed. pain    levothyroxine (SYNTHROID, LEVOTHROID) 50 MCG tablet Take 50 mcg by mouth every morning.     metoprolol (TOPROL-XL) 50 MG 24 hr tablet Take 50 mg by mouth every morning.     Misc Natural Products (OSTEO BI-FLEX ADV DOUBLE ST PO) Take 1 tablet by mouth 2 (two) times daily.     niacin (NIASPAN) 1000 MG CR tablet Take 1,000 mg by mouth at bedtime.     niacin 500 MG tablet Take 500 mg by mouth 2 (two) times daily with a meal.     phenytoin (DILANTIN) 100 MG ER capsule Take 200 mg by mouth 2 (two) times daily.     rOPINIRole (REQUIP) 0.5 MG tablet Take 0.5 mg by mouth 3 (three) times daily.       STOP taking these medications     finasteride (PROSCAR) 5 MG tablet        Follow-up Information    Follow up with Prisca Gearing J on 03/29/2011. (8am)    Contact information:   [address] [facility] [place] [address] [phone]  SignedAnner Crete 03/03/2011, 6:59 AM

## 2011-03-04 ENCOUNTER — Encounter (HOSPITAL_COMMUNITY): Payer: Self-pay | Admitting: Urology

## 2011-07-01 ENCOUNTER — Other Ambulatory Visit: Payer: Self-pay | Admitting: Dermatology

## 2011-09-23 ENCOUNTER — Other Ambulatory Visit: Payer: Self-pay | Admitting: Dermatology

## 2012-04-26 ENCOUNTER — Other Ambulatory Visit: Payer: Self-pay | Admitting: Internal Medicine

## 2012-04-26 ENCOUNTER — Other Ambulatory Visit (HOSPITAL_COMMUNITY): Payer: Self-pay | Admitting: Internal Medicine

## 2012-04-26 ENCOUNTER — Ambulatory Visit
Admission: RE | Admit: 2012-04-26 | Discharge: 2012-04-26 | Disposition: A | Discharge status: Home / Self Care | Payer: Medicare Other | Source: Ambulatory Visit | Attending: Internal Medicine | Admitting: Internal Medicine

## 2012-04-26 DIAGNOSIS — J90 Pleural effusion, not elsewhere classified: Secondary | ICD-10-CM

## 2012-04-26 DIAGNOSIS — R0689 Other abnormalities of breathing: Secondary | ICD-10-CM

## 2012-04-26 MED ORDER — IOHEXOL 300 MG/ML  SOLN
75.0000 mL | Freq: Once | INTRAMUSCULAR | Status: AC | PRN
  Administered 2012-04-26: 75 mL via INTRAVENOUS

## 2012-04-28 ENCOUNTER — Ambulatory Visit (HOSPITAL_COMMUNITY)
Admission: RE | Admit: 2012-04-28 | Discharge: 2012-04-28 | Disposition: A | Discharge status: Home / Self Care | Payer: Medicare Other | Source: Ambulatory Visit | Attending: Internal Medicine | Admitting: Internal Medicine

## 2012-04-28 ENCOUNTER — Ambulatory Visit (HOSPITAL_COMMUNITY)
Admission: RE | Admit: 2012-04-28 | Discharge: 2012-04-28 | Disposition: A | Discharge status: Home / Self Care | Payer: Medicare Other | Source: Ambulatory Visit | Attending: Radiology | Admitting: Radiology

## 2012-04-28 DIAGNOSIS — J90 Pleural effusion, not elsewhere classified: Secondary | ICD-10-CM | POA: Insufficient documentation

## 2012-04-28 LAB — BODY FLUID CELL COUNT WITH DIFFERENTIAL

## 2012-04-28 LAB — LACTATE DEHYDROGENASE, PLEURAL OR PERITONEAL FLUID

## 2012-04-28 NOTE — Procedures (Signed)
US guided rt thora  1.5 L blood tinged fluid Sent to lab per MD  Pt did well  cxr P

## 2012-05-02 LAB — BODY FLUID CULTURE: Gram Stain: NONE SEEN

## 2012-05-04 ENCOUNTER — Telehealth: Payer: Self-pay | Admitting: Oncology

## 2012-05-04 NOTE — Telephone Encounter (Signed)
C/D 05/04/12 for appt. 05/12/12

## 2012-05-04 NOTE — Telephone Encounter (Signed)
S/W pt in re NP appt 01/24 @ 2:30 w/Dr. Truett Perna. . Referring Dr. Earl Gala  Dx-Mets Adenocarcinoma Welcome packet mailed.

## 2012-05-12 ENCOUNTER — Telehealth: Payer: Self-pay | Admitting: Oncology

## 2012-05-12 ENCOUNTER — Ambulatory Visit (HOSPITAL_BASED_OUTPATIENT_CLINIC_OR_DEPARTMENT_OTHER): Payer: Medicare Other

## 2012-05-12 ENCOUNTER — Ambulatory Visit (HOSPITAL_BASED_OUTPATIENT_CLINIC_OR_DEPARTMENT_OTHER): Payer: Medicare Other | Admitting: Oncology

## 2012-05-12 VITALS — BP 120/70 | HR 59 | Temp 96.9°F | Resp 20 | Ht 67.0 in | Wt 181.6 lb

## 2012-05-12 DIAGNOSIS — I4891 Unspecified atrial fibrillation: Secondary | ICD-10-CM

## 2012-05-12 DIAGNOSIS — J910 Malignant pleural effusion: Secondary | ICD-10-CM

## 2012-05-12 DIAGNOSIS — R0609 Other forms of dyspnea: Secondary | ICD-10-CM

## 2012-05-12 DIAGNOSIS — N189 Chronic kidney disease, unspecified: Secondary | ICD-10-CM

## 2012-05-12 DIAGNOSIS — C801 Malignant (primary) neoplasm, unspecified: Secondary | ICD-10-CM

## 2012-05-12 NOTE — Telephone Encounter (Signed)
Gave pt appt for MD only on 05/24/12, PET scan will be scheduled by radiology

## 2012-05-12 NOTE — Progress Notes (Signed)
[facility] New Patient Consult   Referring MD: Tyller Bowlby 79 y.o.  1927/12/15    Reason for Referral: Malignant right pleural effusion     HPI: He developed progressive dyspnea beginning approximately 6 weeks ago. He saw Dr. Earl Gala and a chest x-ray revealed a right lung opacity. A CT of the chest on 04/26/2012 confirmed a large right pleural effusion causing compressive atelectasis of the right lower lobe and right middle lobe. Soft tissue adjacent to the right heart border appeared to largely be due to atelectatic lung. A mass could not be excluded. A truncated bronchus was noted centrally extending toward the right lower lobe making a soft tissue mass difficult to exclude. A few scattered foci of increased opacity peripherally in the right are concerning for small pleural metastases. Calcified pleural plaques were noted on the left. T4 and T8 compression fractures were noted . He underwent an ultrasound guided thoracentesis 04/28/2012. 1.5 L of blood-tinged fluid were removed. The cytology revealed malignant cells consistent with metastatic adenocarcinoma. The tumor cells were positive for cytokeratin 7 and negative for cytokeratin 20, TTF-1, Napsin A, CDX-2, PSA, and PSAP. The images to chemical findings were nonspecific.  He denies pain. He reports improvement in the dyspnea following the thoracentesis. The dyspnea has returned over the past few days.  Past Medical History  Diagnosis Date  . Hypothyroidism     takes Levothyroxine  . Coronary artery disease   .   restless leg syndrome   . Sleep apnea 2011    does not use CPAP  .  history of Seizures   .   diverticulitis                                                                                                2003   . Arthritis     all over body  . Anxiety     comes and goes  . Depression     comes and goes  .  history of atrial fibrillation        . CHF (congestive heart failure)  02/10/11 note    has compensated CHF per Dr. Katrinka Blazing note  . Constipation 08/2009    after surgery  . Urinary tract bacterial infections 08/2010    from surgery and catheters   .    History of kidney stones  .    History of colon polyp, negative colonoscopy 2009  .    Chronic renal insufficiency  .    Osteoporosis  .    BPH  Past Surgical History  Procedure Date  . Cardiac valve replacement 2001  . Back surgery 2011-last one    6 surgeries  . Cardiac catheterization 2001    before valve replacement  . Joint replacement 08/2010    right knee  . Joint replacement 2004    left knee  . Transurethral incision of prostate 03/02/2011    Procedure: TRANSURETHRAL INCISION OF THE PROSTATE (TUIP);  Surgeon: [name];  Location: [facility];  Service: Urology;  Laterality: N/A;  . Esophageal biopsy  03/02/2011    Procedure: BIOPSY;  Surgeon: [name];  Location: [facility];  Service: Urology;;    Family history: A paternal uncle died of a GI malignancy. He has a one half sister. 4 children. No other family history of cancer.   Current outpatient prescriptions:amiodarone (PACERONE) 200 MG tablet, Take 100 mg by mouth daily. Pt takes (0.5 tab) for 100mg  dosage, Disp: , Rfl: ;  Ascorbic Acid (VITAMIN C) 1000 MG tablet, Take 1,000 mg by mouth every morning. , Disp: , Rfl: ;  clonazePAM (KLONOPIN) 0.5 MG tablet, Take 0.5 mg by mouth 2 (two) times daily as needed., Disp: , Rfl: ;  dabigatran (PRADAXA) 150 MG CAPS, Take 150 mg by mouth every 12 (twelve) hours. , Disp: , Rfl:  levothyroxine (SYNTHROID, LEVOTHROID) 50 MCG tablet, Take 50 mcg by mouth every morning. , Disp: , Rfl: ;  metoprolol (TOPROL-XL) 50 MG 24 hr tablet, Take 50 mg by mouth every morning. , Disp: , Rfl: ;  mirabegron ER (MYRBETRIQ) 50 MG TB24, Take 50 mg by mouth daily., Disp: , Rfl: ;  Misc Natural Products (OSTEO BI-FLEX ADV DOUBLE ST PO), Take 1 tablet by mouth 2 (two) times daily. , Disp: , Rfl:  phenytoin (DILANTIN) 100 MG ER  capsule, Take 200 mg by mouth 2 (two) times daily. , Disp: , Rfl: ;  polyethylene glycol (MIRALAX / GLYCOLAX) packet, Take 17 g by mouth daily., Disp: , Rfl: ;  rOPINIRole (REQUIP) 0.5 MG tablet, Take 0.5 mg by mouth 3 (three) times daily. , Disp: , Rfl: ;  sertraline (ZOLOFT) 50 MG tablet, Take 50 mg by mouth daily., Disp: , Rfl:  acetaminophen (TYLENOL) 500 MG tablet, Take 500 mg by mouth every 6 (six) hours as needed. pain , Disp: , Rfl: ;  HYDROcodone-acetaminophen (NORCO) 5-325 MG per tablet, Take 1 tablet by mouth every 6 (six) hours as needed. pain, Disp: , Rfl:   Allergies:  Allergies  Allergen Reactions  . Penicillins Other (See Comments)    Pt doesn't remember the reaction  . 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 He quit smoking cigarettes in 1985 after smoking 2-3 packs per week for 40 years, social alcohol use, he has a history of receiving a blood transfusion, he has been a blood donor, minor asbestos exposure, no risk factor for HIV or hepatitis.    ROS:   Positives include: Occasional night sweats, chronic back pain-relieved after a "nerve block "in September of 2013, chronic constipation relieved with MiraLAX, intermittent blurred vision  A complete ROS was otherwise negative.  Physical Exam:  Blood pressure 120/70, pulse 59, temperature 96.9 F (36.1 C), temperature source Oral, resp. rate 20, height 5\' 7"  (1.702 m), weight 181 lb 9.6 oz (82.373 kg).  HEENT: Oropharynx without visible mass, neck without mass Lungs: Decreased breath sounds at the right lower chest, no respiratory distress. Dullness at the right lower posterior chest. Cardiac: Regular rate and rhythm Abdomen: No hepatosplenic the, nontender, no mass GU: Testes without mass  Vascular: No leg edema Lymph nodes: No cervical, supra-clavicular, axillary, or inguinal nodes Neurologic: Alert and oriented, the motor  exam appears intact in the upper and lower extremities Skin: No rash Musculoskeletal: No spine tenderness   LAB:  CBC-04/26/2012: Hemoglobin 14, platelets 223,000, white count 8.7, ANC 5.9, MCV 95.1   CMP-04/26/2012: Calcium 9.0, albumin 4.1, BUN 26, creatinine 1.38, bilirubin 0.4,  alkaline phosphatase 99      Component Value Date/Time   NA 141 02/23/2011 1020   K 4.9 02/23/2011 1020   CL 107 02/23/2011 1020   CO2 28 02/23/2011 1020   GLUCOSE 84 02/23/2011 1020   BUN 23 02/23/2011 1020   CREATININE 1.18 02/23/2011 1020   CALCIUM 8.9 02/23/2011 1020   PROT 6.5 06/07/2008 0934   ALBUMIN 4.0 06/07/2008 0934   AST 28 06/07/2008 0934   ALT 12 06/07/2008 0934   ALKPHOS 81 06/07/2008 0934   BILITOT 0.9 06/07/2008 0934   GFRNONAA 56* 02/23/2011 1020   GFRAA 65* 02/23/2011 1020     Radiology: As per history of present illness, I reviewed the CT images    Assessment/Plan:   1. 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 Exertional dyspnea secondary to the large right pleural effusion  3. Status post aortic valve replacement  4. History victor fibrillation  5. Chronic back pain  6. Osteoarthritis  7. Chronic renal insufficiency  8. BPH   Disposition:   He has been diagnosed with metastatic carcinoma involving a right pleural effusion. The immunohistochemical staining profile on the pleural fluid cell block is nonspecific. There is no evidence for a primary tumor site on review of his history, physical exam, and the chest CT.  He has a remote smoking history. The differential diagnosis includes lung cancer, mesothelioma, and metastatic carcinoma from another primary tumor site.  He will be referred for placement of a right Pleurx catheter to palliate the symptomatic pleural effusion. He will be scheduled for a staging PET scan after the right pleural fluid is  drained.  Mr. Nevares will return for an office visit on 05/24/2012. We will consider submitting the pleural fluid cell block for a gene array assay if no primary tumor site is identified on the PET scan.  He will contact us in the interim for increased dyspnea.  [name] 05/12/2012, 5:53 PM

## 2012-05-15 ENCOUNTER — Encounter (HOSPITAL_COMMUNITY): Payer: Self-pay | Admitting: Pharmacy Technician

## 2012-05-16 ENCOUNTER — Other Ambulatory Visit: Payer: Self-pay | Admitting: Radiology

## 2012-05-17 ENCOUNTER — Other Ambulatory Visit: Payer: Self-pay | Admitting: Radiology

## 2012-05-18 ENCOUNTER — Encounter (HOSPITAL_COMMUNITY): Payer: Self-pay

## 2012-05-18 ENCOUNTER — Ambulatory Visit (HOSPITAL_COMMUNITY)
Admission: RE | Admit: 2012-05-18 | Discharge: 2012-05-18 | Disposition: A | Discharge status: Home / Self Care | Payer: Medicare Other | Source: Ambulatory Visit | Attending: Oncology | Admitting: Oncology

## 2012-05-18 DIAGNOSIS — Z79899 Other long term (current) drug therapy: Secondary | ICD-10-CM | POA: Insufficient documentation

## 2012-05-18 DIAGNOSIS — C801 Malignant (primary) neoplasm, unspecified: Secondary | ICD-10-CM

## 2012-05-18 DIAGNOSIS — G4730 Sleep apnea, unspecified: Secondary | ICD-10-CM | POA: Insufficient documentation

## 2012-05-18 DIAGNOSIS — E039 Hypothyroidism, unspecified: Secondary | ICD-10-CM | POA: Insufficient documentation

## 2012-05-18 DIAGNOSIS — I509 Heart failure, unspecified: Secondary | ICD-10-CM | POA: Insufficient documentation

## 2012-05-18 DIAGNOSIS — J910 Malignant pleural effusion: Secondary | ICD-10-CM | POA: Insufficient documentation

## 2012-05-18 DIAGNOSIS — I2510 Atherosclerotic heart disease of native coronary artery without angina pectoris: Secondary | ICD-10-CM | POA: Insufficient documentation

## 2012-05-18 LAB — APTT: aPTT: 44 seconds — ABNORMAL HIGH (ref 24–37)

## 2012-05-18 LAB — CBC
HCT: 42.9 % (ref 39.0–52.0)
MCV: 94.3 fL (ref 78.0–100.0)
RBC: 4.55 MIL/uL (ref 4.22–5.81)
WBC: 9.1 10*3/uL (ref 4.0–10.5)

## 2012-05-18 LAB — BASIC METABOLIC PANEL
CO2: 24 mEq/L (ref 19–32)
Chloride: 105 mEq/L (ref 96–112)
GFR calc Af Amer: 58 mL/min — ABNORMAL LOW (ref >90)
Potassium: 4.4 mEq/L (ref 3.5–5.1)
Sodium: 138 mEq/L (ref 135–145)

## 2012-05-18 LAB — PROTIME-INR: INR: 1.27 (ref 0.00–1.49)

## 2012-05-18 MED ORDER — LIDOCAINE HCL 1 % IJ SOLN
INTRAMUSCULAR | Status: AC
  Filled 2012-05-18: qty 20

## 2012-05-18 MED ORDER — FENTANYL CITRATE 0.05 MG/ML IJ SOLN
INTRAMUSCULAR | Status: AC
  Filled 2012-05-18: qty 6

## 2012-05-18 MED ORDER — VANCOMYCIN HCL IN DEXTROSE 1-5 GM/200ML-% IV SOLN
1000.0000 mg | Freq: Once | INTRAVENOUS | Status: AC
  Administered 2012-05-18: 1000 mg via INTRAVENOUS
  Filled 2012-05-18: qty 200

## 2012-05-18 MED ORDER — SODIUM CHLORIDE 0.9 % IV SOLN
Freq: Once | INTRAVENOUS | Status: AC
  Administered 2012-05-18: 14:00:00 via INTRAVENOUS

## 2012-05-18 MED ORDER — MIDAZOLAM HCL 2 MG/2ML IJ SOLN
INTRAMUSCULAR | Status: AC | PRN
  Administered 2012-05-18: 2 mg via INTRAVENOUS

## 2012-05-18 MED ORDER — MIDAZOLAM HCL 2 MG/2ML IJ SOLN
INTRAMUSCULAR | Status: AC
  Filled 2012-05-18: qty 6

## 2012-05-18 MED ORDER — FENTANYL CITRATE 0.05 MG/ML IJ SOLN
INTRAMUSCULAR | Status: AC | PRN
  Administered 2012-05-18: 50 ug via INTRAVENOUS

## 2012-05-18 NOTE — H&P (Signed)
[name] is an 79 y.o. male.   Chief Complaint: shortness of breath HPI: Patient with recurrent symptomatic malignant right pleural effusion presents today for placement of a tunneled right pleurx catheter.  Past Medical History  Diagnosis Date  . Hypothyroidism     takes Levothyroxine  . Coronary artery disease     valve replaced  . Sleep apnea 2011    does not use CPAP  . Seizures     cannot remember last seizure  . Arthritis     all over body  . Anxiety     comes and goes  . Depression     comes and goes  . Dysrhythmia     recurrent arrhythmia w/AV replacement  . CHF (congestive heart failure) 02/10/11 note    has compensated CHF per Dr. Katrinka Blazing note  . Constipation 08/2009    after surgery  . Urinary tract bacterial infections 08/2010    from surgery and catheters    Past Surgical History  Procedure Date  . Cardiac valve replacement 2001  . Back surgery 2011-last one    6 surgeries  . Cardiac catheterization 2001    before valve replacement  . Joint replacement 08/2010    right knee  . Joint replacement 2004    left knee  . Transurethral incision of prostate 03/02/2011    Procedure: TRANSURETHRAL INCISION OF THE PROSTATE (TUIP);  Surgeon: [name];  Location: [facility];  Service: Urology;  Laterality: N/A;  . Esophageal biopsy 03/02/2011    Procedure: BIOPSY;  Surgeon: [name];  Location: [facility];  Service: Urology;;    No family history on file. Social History:  reports that he quit smoking about 28 years ago. His smoking use included Cigarettes. He has a 30 pack-year smoking history. He does not have any smokeless tobacco history on file. He reports that he does not drink alcohol or use illicit drugs.  Allergies:  Allergies  Allergen Reactions  . Penicillins Other (See Comments)    Pt doesn't remember the reaction  . Warfarin And Related Other (See Comments)    Pt gets the shakes    Current outpatient prescriptions:alendronate (FOSAMAX) 70 MG  tablet, Take 70 mg by mouth Once a week. Sunday mornings, Disp: , Rfl: ;  amiodarone (PACERONE) 200 MG tablet, Take 100 mg by mouth daily. Pt takes (0.5 tab) for 100mg  dosage, Disp: , Rfl: ;  atorvastatin (LIPITOR) 10 MG tablet, Take 10 mg by mouth every morning., Disp: , Rfl: ;  calcium-vitamin D (OSCAL WITH D) 500-200 MG-UNIT per tablet, Take 1 tablet by mouth daily., Disp: , Rfl:  clonazePAM (KLONOPIN) 0.5 MG tablet, Take 0.5 mg by mouth 2 (two) times daily as needed. For anxiety, Disp: , Rfl: ;  dabigatran (PRADAXA) 150 MG CAPS, Take 150 mg by mouth every 12 (twelve) hours. , Disp: , Rfl: ;  HYDROcodone-acetaminophen (NORCO) 5-325 MG per tablet, Take 1 tablet by mouth every 6 (six) hours as needed. pain, Disp: , Rfl: ;  levothyroxine (SYNTHROID, LEVOTHROID) 50 MCG tablet, Take 50 mcg by mouth every morning. , Disp: , Rfl:  metoprolol (TOPROL-XL) 50 MG 24 hr tablet, Take 50 mg by mouth every morning. , Disp: , Rfl: ;  mirabegron ER (MYRBETRIQ) 50 MG TB24, Take 50 mg by mouth daily., Disp: , Rfl: ;  Misc Natural Products (OSTEO BI-FLEX ADV DOUBLE ST PO), Take 1 tablet by mouth 2 (two) times daily. , Disp: , Rfl: ;  phenytoin (DILANTIN) 100  MG ER capsule, Take 200 mg by mouth 2 (two) times daily. , Disp: , Rfl:  Polyethyl Glycol-Propyl Glycol (SYSTANE OP), Apply 1 drop to eye as needed. For dry eyes, Disp: , Rfl: ;  polyethylene glycol (MIRALAX / GLYCOLAX) packet, Take 17 g by mouth daily., Disp: , Rfl: ;  rOPINIRole (REQUIP) 0.5 MG tablet, Take 0.5 mg by mouth 3 (three) times daily. , Disp: , Rfl: ;  sertraline (ZOLOFT) 50 MG tablet, Take 50 mg by mouth every evening. , Disp: , Rfl:  vitamin C (ASCORBIC ACID) 500 MG tablet, Take 500 mg by mouth daily., Disp: , Rfl:  No current facility-administered medications for this encounter. Facility-Administered Medications Ordered in Other Encounters: 0.9 %  sodium chloride infusion, , Intravenous, Once, Robet Leu, PA;  vancomycin (VANCOCIN) IVPB 1000 mg/200 mL  premix, 1,000 mg, Intravenous, Once, Robet Leu, PA  Results for orders placed during the hospital encounter of 04/28/12  AMYLASE, BODY FLUID      Component Value Range   Amylase, Fluid 39     Fluid Type-FAMY Body Fluid    BODY FLUID CELL COUNT WITH DIFFERENTIAL      Component Value Range   Fluid Type-FCT Body Fluid     Color, Fluid ORANGE (*) YELLOW   Appearance, Fluid CLOUDY (*) CLEAR   WBC, Fluid 8  0 - 1000 cu mm   Neutrophil Count, Fluid TOO FEW TO COUNT, SMEAR AVAILABLE FOR REVIEW  0 - 25 %   Other Cells, Fluid SEE CYTOLGOY REPORT    BODY FLUID CULTURE      Component Value Range   Specimen Description PLEURAL     Special Requests NONE     Gram Stain       Value: NO WBC SEEN     NO ORGANISMS SEEN   Culture NO GROWTH 3 DAYS     Report Status 05/02/2012 FINAL    GLUCOSE, SEROUS FLUID      Component Value Range   Glucose, Fluid 49     Fluid Type-FGLU Body Fluid    LACTATE DEHYDROGENASE, BODY FLUID      Component Value Range   LD, Fluid 227 (*) 3 - 23 U/L   Fluid Type-FLDH Body Fluid     Results for orders placed during the hospital encounter of 04/28/12  AMYLASE, BODY FLUID      Component Value Range   Amylase, Fluid 39     Fluid Type-FAMY Body Fluid    BODY FLUID CELL COUNT WITH DIFFERENTIAL      Component Value Range   Fluid Type-FCT Body Fluid     Color, Fluid ORANGE (*) YELLOW   Appearance, Fluid CLOUDY (*) CLEAR   WBC, Fluid 8  0 - 1000 cu mm   Neutrophil Count, Fluid TOO FEW TO COUNT, SMEAR AVAILABLE FOR REVIEW  0 - 25 %   Other Cells, Fluid SEE CYTOLGOY REPORT    BODY FLUID CULTURE      Component Value Range   Specimen Description PLEURAL     Special Requests NONE     Gram Stain       Value: NO WBC SEEN     NO ORGANISMS SEEN   Culture NO GROWTH 3 DAYS     Report Status 05/02/2012 FINAL    GLUCOSE, SEROUS FLUID      Component Value Range   Glucose, Fluid 49     Fluid Type-FGLU Body Fluid    LACTATE DEHYDROGENASE, BODY FLUID  Component Value  Range   LD, Fluid 227 (*) 3 - 23 U/L   Fluid Type-FLDH Body Fluid     05/18/12   WBC 9.1     HGB 14.8     PLTS 210K      PT 15.6      INR 1.27       PTT 44 Review of Systems  Constitutional: Negative for fever.       Occ hot flashes (pt describes as "flushes") beginning in LE's and moving up to chest  Eyes:       Intermittent blurred vision  Respiratory: Positive for shortness of breath. Negative for cough.   Cardiovascular: Negative for chest pain.  Gastrointestinal: Positive for constipation. Negative for nausea, vomiting and abdominal pain.  Musculoskeletal: Negative for back pain.  Neurological: Negative for headaches.  Endo/Heme/Allergies: Does not bruise/bleed easily.  Vitals: BP 131/64  HR 74  R 18  TEMP 97  O2 SATS 96%RA Physical Exam  Constitutional: He is oriented to person, place, and time. He appears well-developed and well-nourished.  Cardiovascular: Normal rate and regular rhythm.   Respiratory: Effort normal.       Dim BS rt base, left clear  GI: Soft. Bowel sounds are normal. There is no tenderness.  Musculoskeletal: Normal range of motion. He exhibits edema.  Neurological: He is alert and oriented to person, place, and time.     Assessment/Plan Pt with recurrent symptomatic malignant right pleural effusion. Plan is for placement of a tunneled right pleurx catheter today. Details/risks of procedure d/w pt with his understanding and consent.  Jarita Raval,D KEVIN 05/18/2012, 1:51 PM

## 2012-05-18 NOTE — Procedures (Signed)
Procedure:  Right tunneled pleural drainage catheter placement Findings:  Tunneled Bard Aspira drainage catheter placed in right pleural space.  2 liters of serosanguinous fluid removed.  Catheter placement and drainage well tolerated.

## 2012-05-18 NOTE — Progress Notes (Signed)
Post procedure Demonstrated by Olegario Messier in IR to patient how to apply drain to Plurex cath drain and also dressing change. Pt verbalized understanding,Pt also was given a box of supplies for 28 days including dressings changes and drainage bag Right chest cath is all enclosed under dressing capped

## 2012-05-18 NOTE — H&P (Signed)
For right pleural drainage catheter placement today.

## 2012-05-19 ENCOUNTER — Other Ambulatory Visit: Payer: Self-pay | Admitting: Oncology

## 2012-05-19 ENCOUNTER — Encounter (HOSPITAL_COMMUNITY)
Admission: RE | Admit: 2012-05-19 | Discharge: 2012-05-19 | Disposition: A | Discharge status: Home / Self Care | Payer: Medicare Other | Source: Ambulatory Visit | Attending: Oncology | Admitting: Oncology

## 2012-05-19 ENCOUNTER — Encounter (HOSPITAL_COMMUNITY): Payer: Self-pay

## 2012-05-19 DIAGNOSIS — C801 Malignant (primary) neoplasm, unspecified: Secondary | ICD-10-CM | POA: Insufficient documentation

## 2012-05-19 LAB — GLUCOSE, CAPILLARY: Glucose-Capillary: 90 mg/dL (ref 70–99)

## 2012-05-19 MED ORDER — FLUDEOXYGLUCOSE F - 18 (FDG) INJECTION
16.2000 | Freq: Once | INTRAVENOUS | Status: AC | PRN
  Administered 2012-05-19: 16.2 via INTRAVENOUS

## 2012-05-24 ENCOUNTER — Ambulatory Visit (HOSPITAL_BASED_OUTPATIENT_CLINIC_OR_DEPARTMENT_OTHER): Payer: Medicare Other | Admitting: Oncology

## 2012-05-24 ENCOUNTER — Telehealth: Payer: Self-pay | Admitting: Oncology

## 2012-05-24 ENCOUNTER — Other Ambulatory Visit: Payer: Self-pay | Admitting: *Deleted

## 2012-05-24 VITALS — BP 112/51 | HR 78 | Temp 97.4°F | Resp 20 | Ht 66.0 in | Wt 177.3 lb

## 2012-05-24 DIAGNOSIS — R0989 Other specified symptoms and signs involving the circulatory and respiratory systems: Secondary | ICD-10-CM

## 2012-05-24 DIAGNOSIS — N189 Chronic kidney disease, unspecified: Secondary | ICD-10-CM

## 2012-05-24 DIAGNOSIS — I4891 Unspecified atrial fibrillation: Secondary | ICD-10-CM

## 2012-05-24 DIAGNOSIS — J910 Malignant pleural effusion: Secondary | ICD-10-CM

## 2012-05-24 NOTE — Progress Notes (Signed)
   [facility]    OFFICE PROGRESS NOTE   INTERVAL HISTORY:   He returns as scheduled. He underwent placement of a Pleurx catheter on 05/19/2012. 2 L of fluid were removed. He reports improvement in the dyspnea after the fluid was drained. He had discomfort at the catheter site for several days after the procedure. This has improved. The catheter has not been drained since 05/19/2012. No new complaint. He reports episodes of "flushing "for the past few years. These occur infrequently.  Objective:  Vital signs in last 24 hours:  Blood pressure 112/51, pulse 78, temperature 97.4 F (36.3 C), temperature source Oral, resp. rate 20, height 5\' 6"  (1.676 m), weight 177 lb 4.8 oz (80.423 kg).    HEENT: Neck without mass Lymphatics: No cervical, supraclavicular, or axillary nodes Resp: Decreased breath sounds at the right lower chest, no respiratory distress. Pleurx catheter site with a dry gauze dressing at the right lateral chest Cardio: Regular rate and rhythm GI: No hepatosplenomegaly Vascular: No leg edema   X-rays: PET scan 05/19/2012-linear hypermetabolic activity associated with an area of atelectasis at the lateral right middle lobe. No additional evidence of a primary neoplasm. No other hypermetabolic activity. Images were reviewed with Mr. Mooneyhan.   Medications: I have reviewed the patient's current medications.  Assessment/Plan: 1.Metastatic carcinoma involving a right pleural effusion, no apparent primary tumor site based on review of the history, physical exam, and imaging studies to date  -Staging PET scan 05/19/2012 with with an area of hypermetabolic atelectatic lung in the right middle lobe and no other evidence of hypermetabolic activity 2. Exertional dyspnea secondary to the large right pleural effusion , status post placement of a Pleurx catheter on 05/19/2012 3. Status post aortic valve replacement  4. History atrial fibrillation  5. Chronic back pain    6. Osteoarthritis  7. Chronic renal insufficiency  8. BPH    Disposition:  The dyspnea improved after drainage of the right pleural effusion on 05/19/2012. He otherwise appears stable. We have not defined a primary tumor site. I will review the PET scan and decide on the indication for a pulmonary referral/bronchoscopy. The cell block from the right pleural effusion will be submitted for a gene array assay in an attempt to arrive at a primary tumor site.  We will arrange for a home care nurse to assist with draining the Pleurx catheter. Mr. Hurlbut will return for an office visit in 2 weeks.   Thornton Papas, MD  05/24/2012  2:03 PM

## 2012-05-24 NOTE — Progress Notes (Signed)
Right side Pleurx Catheter in place. Pt has not drained catheter at home. Needs reinforcement of teaching. Unable to work pt in at [facility]. Referral sent to [facility]. They can see pt 05/25/12. Orders entered. Pt reports his breathing has improved over the past 2 days. R side pain is improved as well. Has Hydrocodone for pain. Pt agreeable to plan for home health RN to come to the home for teaching of catheter drainage and care. He understands to report to ED if he experiences dyspnea.

## 2012-05-24 NOTE — Telephone Encounter (Signed)
gv and printed appt schedule for Feb...pt aware °

## 2012-05-30 ENCOUNTER — Ambulatory Visit (INDEPENDENT_AMBULATORY_CARE_PROVIDER_SITE_OTHER): Payer: Medicare Other | Admitting: Pulmonary Disease

## 2012-05-30 ENCOUNTER — Encounter: Payer: Self-pay | Admitting: Pulmonary Disease

## 2012-05-30 VITALS — BP 96/62 | HR 76 | Temp 97.8°F | Ht 66.0 in | Wt 179.0 lb

## 2012-05-30 DIAGNOSIS — J90 Pleural effusion, not elsewhere classified: Secondary | ICD-10-CM

## 2012-05-30 DIAGNOSIS — J9819 Other pulmonary collapse: Secondary | ICD-10-CM

## 2012-05-30 DIAGNOSIS — J9811 Atelectasis: Secondary | ICD-10-CM

## 2012-05-30 MED ORDER — HYDROCODONE-ACETAMINOPHEN 5-325 MG PO TABS: 1.0000 | ORAL_TABLET | Freq: Four times a day (QID) | ORAL | Status: DC | PRN

## 2012-05-30 NOTE — Assessment & Plan Note (Signed)
The patient currently has a Pleurx catheter in place for drainage.

## 2012-05-30 NOTE — Progress Notes (Signed)
  Subjective:    Patient ID: [name], male    DOB: 05/28/1927, 79 y.o.   MRN: [id number]  HPI The patient is an 79-year-old male who I've been asked to see for an abnormal CT chest.  The patient was in his usual state of health until recently when he began to notice shortness of breath while swimming.  He also noted cough as well as pleuritic chest pain, as well as a weight loss.  A chest x-ray was done which showed a large right pleural effusion, and ultimately a thoracentesis revealed an exudative effusion with a low glucose.  The pathology returned positive for adenocarcinoma.  Immunohistochemical markers have been nonspecific.  Because of a recurring effusion.  The patient has had a Pleurx catheter placed the end of last month.  He has had a CT scan of his chest that showed a large right pleural effusion, as well as a soft tissue density adjacent to the right heart border that appeared to be atelectasis of the right middle lobe.  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  However, he did not handle the material directly.  He has recently had a PET scan which showed hypermetabolic activity in the area of the right middle lobe atelectasis.  This appeared to be focal rather than in the whole area.  The PET scan was otherwise unremarkable.   Review of Systems  Constitutional: Negative for fever and unexpected weight change.  HENT: Negative for ear pain, nosebleeds, congestion, sore throat, rhinorrhea, sneezing, trouble swallowing, dental problem, postnasal drip and sinus pressure.   Eyes: Negative for redness and itching.  Respiratory: Positive for cough and shortness of breath. Negative for chest tightness and wheezing.   Cardiovascular: Positive for palpitations. Negative for leg swelling.  Gastrointestinal: Negative for nausea and vomiting.  Genitourinary: Negative for dysuria.  Musculoskeletal: Positive for joint  swelling.  Skin: Negative for rash.  Neurological: Negative for headaches.  Hematological: Does not bruise/bleed easily.  Psychiatric/Behavioral: Positive for dysphoric mood. The patient is not nervous/anxious.        Objective:   Physical Exam Constitutional:  Well developed, no acute distress  HENT:  Nares patent without discharge  Oropharynx without exudate, palate and uvula are normal  Eyes:  Perrla, eomi, no scleral icterus  Neck:  No JVD, no TMG  Cardiovascular:  Normal rate, regular rhythm, no rubs or gallops.  No murmurs        Intact distal pulses  Pulmonary :  Normal breath sounds, no stridor or respiratory distress   No rhonchi, or wheezing, minimal right basilar crackles.  Abdominal:  Soft, nondistended, bowel sounds present.  No tenderness noted.   Musculoskeletal:  No lower extremity edema noted.  Lymph Nodes:  No cervical lymphadenopathy noted  Skin:  No cyanosis noted  Neurologic:  Alert, appropriate, moves all 4 extremities without obvious deficit.         Assessment & Plan:   

## 2012-05-30 NOTE — Patient Instructions (Addendum)
Will see if we can get bronchoscopy scheduled for in am.  Do not each or drink anything after midnight until you hear from me in am. Hold pradaxa tonight and in am.

## 2012-05-30 NOTE — Assessment & Plan Note (Signed)
The patient has atelectasis in the right middle lobe on CT scan, which also has focal hypermetabolic activity on PET scan.  There does not appear to be a central obstruction, and the airways appeared to be patent and going out to the atelectatic lung.  Because the abnormal focus on PET is further out, I wonder if he may have a more distal endobronchial process within the atelectatic area.  The other possibility is whether he may have a consolidated focal area of BAC.  Because we have yet to identify the primary site, I think it is worthwhile doing an airway exam with bronchoscopy.  I am willing to do washes and brushes from the right middle lobe, but am hesitant to do biopsies with the patient being on anti-coagulation.  The patient understands the bleeding risk, as well as the possibility for respiratory distress.  He is willing to proceed with bronchoscopy.

## 2012-05-31 ENCOUNTER — Ambulatory Visit (HOSPITAL_COMMUNITY)
Admission: RE | Admit: 2012-05-31 | Discharge: 2012-05-31 | Disposition: A | Discharge status: Home / Self Care | Payer: Medicare Other | Source: Ambulatory Visit | Attending: Pulmonary Disease | Admitting: Pulmonary Disease

## 2012-05-31 ENCOUNTER — Encounter (HOSPITAL_COMMUNITY): Payer: Self-pay

## 2012-05-31 ENCOUNTER — Encounter (HOSPITAL_COMMUNITY)
Admission: RE | Disposition: A | Discharge status: Home / Self Care | Payer: Self-pay | Source: Ambulatory Visit | Attending: Pulmonary Disease

## 2012-05-31 DIAGNOSIS — R918 Other nonspecific abnormal finding of lung field: Secondary | ICD-10-CM

## 2012-05-31 DIAGNOSIS — Z77090 Contact with and (suspected) exposure to asbestos: Secondary | ICD-10-CM | POA: Insufficient documentation

## 2012-05-31 DIAGNOSIS — J984 Other disorders of lung: Secondary | ICD-10-CM | POA: Insufficient documentation

## 2012-05-31 DIAGNOSIS — C801 Malignant (primary) neoplasm, unspecified: Secondary | ICD-10-CM | POA: Insufficient documentation

## 2012-05-31 DIAGNOSIS — J910 Malignant pleural effusion: Secondary | ICD-10-CM | POA: Insufficient documentation

## 2012-05-31 DIAGNOSIS — J9819 Other pulmonary collapse: Secondary | ICD-10-CM | POA: Insufficient documentation

## 2012-05-31 HISTORY — PX: VIDEO BRONCHOSCOPY: SHX5072

## 2012-05-31 SURGERY — VIDEO BRONCHOSCOPY WITHOUT FLUORO
Anesthesia: Moderate Sedation | Laterality: Bilateral

## 2012-05-31 MED ORDER — MIDAZOLAM HCL 10 MG/2ML IJ SOLN
INTRAMUSCULAR | Status: AC
  Filled 2012-05-31: qty 4

## 2012-05-31 MED ORDER — LIDOCAINE HCL (PF) 1 % IJ SOLN
INTRAMUSCULAR | Status: DC | PRN
  Administered 2012-05-31 (×2): 6 mL

## 2012-05-31 MED ORDER — LIDOCAINE HCL 2 % EX GEL
CUTANEOUS | Status: DC | PRN
  Administered 2012-05-31: 1

## 2012-05-31 MED ORDER — PHENYLEPHRINE HCL 0.25 % NA SOLN
NASAL | Status: DC | PRN
  Administered 2012-05-31: 2

## 2012-05-31 MED ORDER — MEPERIDINE HCL 100 MG/ML IJ SOLN
INTRAMUSCULAR | Status: AC
  Filled 2012-05-31: qty 2

## 2012-05-31 MED ORDER — MIDAZOLAM HCL 10 MG/2ML IJ SOLN
INTRAMUSCULAR | Status: DC | PRN
  Administered 2012-05-31: 5 mg via INTRAVENOUS

## 2012-05-31 MED ORDER — MEPERIDINE HCL 25 MG/ML IJ SOLN
INTRAMUSCULAR | Status: DC | PRN
  Administered 2012-05-31: 50 mg via INTRAVENOUS

## 2012-05-31 NOTE — H&P (View-Only) (Signed)
  Subjective:    Patient ID: [name], male    DOB: Aug 17, 1927, 79 y.o.   MRN: [id number]  HPI The patient is an 79 year old male who I've been asked to see for an abnormal CT chest.  The patient was in his usual state of health until recently when he began to notice shortness of breath while swimming.  He also noted cough as well as pleuritic chest pain, as well as a weight loss.  A chest x-ray was done which showed a large right pleural effusion, and ultimately a thoracentesis revealed an exudative effusion with a low glucose.  The pathology returned positive for adenocarcinoma.  Immunohistochemical markers have been nonspecific.  Because of a recurring effusion.  The patient has had a Pleurx catheter placed the end of last month.  He has had a CT scan of his chest that showed a large right pleural effusion, as well as a soft tissue density adjacent to the right heart border that appeared to be atelectasis of the right middle lobe.  There are also pleural calcifications, and the patient tells me he has worked in [employer] with exposure to asbestos.  However, he did not handle the material directly.  He has recently had a PET scan which showed hypermetabolic activity in the area of the right middle lobe atelectasis.  This appeared to be focal rather than in the whole area.  The PET scan was otherwise unremarkable.   Review of Systems  Constitutional: Negative for fever and unexpected weight change.  HENT: Negative for ear pain, nosebleeds, congestion, sore throat, rhinorrhea, sneezing, trouble swallowing, dental problem, postnasal drip and sinus pressure.   Eyes: Negative for redness and itching.  Respiratory: Positive for cough and shortness of breath. Negative for chest tightness and wheezing.   Cardiovascular: Positive for palpitations. Negative for leg swelling.  Gastrointestinal: Negative for nausea and vomiting.  Genitourinary: Negative for dysuria.  Musculoskeletal: Positive for joint  swelling.  Skin: Negative for rash.  Neurological: Negative for headaches.  Hematological: Does not bruise/bleed easily.  Psychiatric/Behavioral: Positive for dysphoric mood. The patient is not nervous/anxious.        Objective:   Physical Exam Constitutional:  Well developed, no acute distress  HENT:  Nares patent without discharge  Oropharynx without exudate, palate and uvula are normal  Eyes:  Perrla, eomi, no scleral icterus  Neck:  No JVD, no TMG  Cardiovascular:  Normal rate, regular rhythm, no rubs or gallops.  No murmurs        Intact distal pulses  Pulmonary :  Normal breath sounds, no stridor or respiratory distress   No rhonchi, or wheezing, minimal right basilar crackles.  Abdominal:  Soft, nondistended, bowel sounds present.  No tenderness noted.   Musculoskeletal:  No lower extremity edema noted.  Lymph Nodes:  No cervical lymphadenopathy noted  Skin:  No cyanosis noted  Neurologic:  Alert, appropriate, moves all 4 extremities without obvious deficit.         Assessment & Plan:

## 2012-05-31 NOTE — Op Note (Deleted)
NAMEPEARSON, PICOU NO.:  [id number]  MEDICAL RECORD NO.:  [id number]  LOCATION:  RESP                         FACILITY:  [facility]  PHYSICIAN:  Barbaraann Share, MD,FCCPDATE OF BIRTH:  03/20/30  DATE OF PROCEDURE:  05/31/2012 DATE OF DISCHARGE:                              OPERATIVE REPORT   PROCEDURE:  Flexible fiberoptic bronchoscopy with video and biopsy.  INDICATION:  Malignant pleural effusion with adenocarcinoma, and abnormal density in the right middle lobe with atelectasis that is positive on PET scanning.  OPERATOR:  Barbaraann Share, MD, FCCP.  ANESTHESIA:  Versed 5 mg IV, Demerol 50 mg IV, and topical 1% lidocaine to the vocal cords and airways during the procedure.  DESCRIPTION:  After obtaining informed consent and under close cardiopulmonary monitoring, the above preop anesthesia was given, and the fiberoptic scope was passed to the right naris and into the posterior pharynx where there was no lesions or other abnormalities seen.  The vocal cords appeared to be within normal limits and moved bilaterally on phonation.  The scope was then passed into the trachea where it was examined along its entire length down to the level of the carina, which appeared sharp.  The left and right tracheobronchial trees were examined serially to the subsegmental level with no endobronchial abnormality being noted.  Specifically, there was no endobronchial lesion in the right middle lobe, as far as the scope could be passed. Bronchoalveolar lavage was then done from the various segments of the right middle lobe, and bronchial brushes were done primarily from the lateral segment of the right middle lobe with excellent cytologic material being obtained.  Overall, the patient tolerated the procedure quite well and there were no complications.     Barbaraann Share, MD,FCCP     [facility]/MEDQ  D:  05/31/2012  T:  05/31/2012  Job:  [id number]

## 2012-05-31 NOTE — Progress Notes (Signed)
Video bronchoscopy procedure performed. Bronchial washings intervention performed. Brushings intervention performed.    

## 2012-05-31 NOTE — Interval H&P Note (Signed)
History and Physical Interval Note:  05/31/2012 12:48 PM  Greg Cutter  has presented today for surgery, with the diagnosis of Lung Mass  The various methods of treatment have been discussed with the patient and family. After consideration of risks, benefits and other options for treatment, the patient has consented to  Procedure(s): VIDEO BRONCHOSCOPY WITHOUT FLUORO (Bilateral) as a surgical intervention .  The patient's history has been reviewed, patient examined, no change in status, stable for surgery.  I have reviewed the patient's chart and labs.  Questions were answered to the patient's satisfaction.     Barbaraann Share

## 2012-05-31 NOTE — Op Note (Signed)
Dictation #:  [id number]

## 2012-05-31 NOTE — Op Note (Signed)
NAME:  [name], William                 ACCOUNT NO.:  [id number]  MEDICAL RECORD NO.:  [id number]  LOCATION:  RESP                         FACILITY:  [facility]  PHYSICIAN:  Keith M. Clance, MD,FCCPDATE OF BIRTH:  [birth year]19[birth year]  DATE OF PROCEDURE:  05/31/2012 DATE OF DISCHARGE:                              OPERATIVE REPORT   PROCEDURE:  Flexible fiberoptic bronchoscopy with video and biopsy.  INDICATION:  Malignant pleural effusion with adenocarcinoma, and abnormal density in the right middle lobe with atelectasis that is positive on PET scanning.  OPERATOR:  Keith M. Clance, MD, FCCP.  ANESTHESIA:  Versed 5 mg IV, Demerol 50 mg IV, and topical 1% lidocaine to the vocal cords and airways during the procedure.  DESCRIPTION:  After obtaining informed consent and under close cardiopulmonary monitoring, the above preop anesthesia was given, and the fiberoptic scope was passed to the right naris and into the posterior pharynx where there was no lesions or other abnormalities seen.  The vocal cords appeared to be within normal limits and moved bilaterally on phonation.  The scope was then passed into the trachea where it was examined along its entire length down to the level of the carina, which appeared sharp.  The left and right tracheobronchial trees were examined serially to the subsegmental level with no endobronchial abnormality being noted.  Specifically, there was no endobronchial lesion in the right middle lobe, as far as the scope could be passed. Bronchoalveolar lavage was then done from the various segments of the right middle lobe, and bronchial brushes were done primarily from the lateral segment of the right middle lobe with excellent cytologic material being obtained.  Overall, the patient tolerated the procedure quite well and there were no complications.     Keith M. Clance, MD,FCCP     [facility]/MEDQ  D:  05/31/2012  T:  05/31/2012  Job:  [id number] 

## 2012-06-03 LAB — CULTURE, BAL-QUANTITATIVE W GRAM STAIN: Special Requests: NORMAL

## 2012-06-05 ENCOUNTER — Encounter (HOSPITAL_COMMUNITY): Payer: Self-pay | Admitting: Pulmonary Disease

## 2012-06-06 ENCOUNTER — Telehealth: Payer: Self-pay | Admitting: Oncology

## 2012-06-06 NOTE — Telephone Encounter (Signed)
Faxed rec's to Dr. Shelle Iron office @ [phone].

## 2012-06-08 ENCOUNTER — Ambulatory Visit (HOSPITAL_BASED_OUTPATIENT_CLINIC_OR_DEPARTMENT_OTHER): Payer: Medicare Other | Admitting: Nurse Practitioner

## 2012-06-08 ENCOUNTER — Telehealth: Payer: Self-pay | Admitting: *Deleted

## 2012-06-08 ENCOUNTER — Telehealth: Payer: Self-pay | Admitting: Oncology

## 2012-06-08 VITALS — BP 90/58 | HR 64 | Temp 96.8°F | Resp 20 | Ht 66.0 in | Wt 176.3 lb

## 2012-06-08 DIAGNOSIS — J910 Malignant pleural effusion: Secondary | ICD-10-CM

## 2012-06-08 NOTE — Telephone Encounter (Signed)
Needs order for one more visit next week for teaching of Plurex drain care. Were upset from MD visit today and not responsive to teaching.  Approved visit X 1 next week.

## 2012-06-08 NOTE — Patient Instructions (Addendum)
  Please document date pleural fluid is drained and amount that was drained.

## 2012-06-08 NOTE — Progress Notes (Signed)
OFFICE PROGRESS NOTE  Interval history:  Mr. Sporer returns as scheduled. He underwent a bronchoscopy on 05/31/2012 by Dr. Shelle Iron. There was no lesion in the right middle lobe as far as the scope could be passed. No malignant cells were identified in bronchial brushings from the right middle lobe and bronchial lavage right middle lobe.  He denies shortness of breath. He last drained he Pleuryx catheter on 06/03/2012. He did not measure the drainage. He denies pain. Appetite is stable. He remains very active.   Objective: Blood pressure 90/58, pulse 64, temperature 96.8 F (36 C), temperature source Oral, resp. rate 20, height 5\' 6"  (1.676 m), weight 176 lb 4.8 oz (79.969 kg).  Oropharynx is without thrush or ulceration. Lungs are clear. Good air movement bilaterally. Pleurx catheter site covered with a gauze dressing at the right lateral chest. Regular cardiac rhythm. Abdomen soft and nontender. No organomegaly. Trace bilateral pretibial edema bilaterally.  Lab Results: Lab Results  Component Value Date   WBC 9.1 05/18/2012   HGB 14.8 05/18/2012   HCT 42.9 05/18/2012   MCV 94.3 05/18/2012   PLT 210 05/18/2012    Chemistry:    Chemistry      Component Value Date/Time   NA 138 05/18/2012 1337   K 4.4 05/18/2012 1337   CL 105 05/18/2012 1337   CO2 24 05/18/2012 1337   BUN 25* 05/18/2012 1337   CREATININE 1.29 05/18/2012 1337      Component Value Date/Time   CALCIUM 8.9 05/18/2012 1337   ALKPHOS 81 06/07/2008 0934   AST 28 06/07/2008 0934   ALT 12 06/07/2008 0934   BILITOT 0.9 06/07/2008 0934       Studies/Results: Ir Guided Drain W Catheter Placement  05/19/2012  *RADIOLOGY REPORT*  Clinical Data:  Metastatic adenocarcinoma with recurrent right malignant pleural effusion.  The patient requires placement of a tunneled pleural drainage catheter.  INSERTION OF TUNNELED PLEURAL DRAINAGE CATHETER  Sedation: 2.0 mg IV Versed; 50 mcg IV Fentanyl.  Total Moderate Sedation Time: 25 minutes.   Additional Medications:  1 gram IV vancomycin.  Vancomycin was given within two hours of incision.  Vancomycin was given due to an antibiotic allergy.  Fluoroscopy Time: 0.1 minutes.  Procedure:  The procedure, risks, benefits, and alternatives were explained to the patient.  Questions regarding the procedure were encouraged and answered.  The patient understands and consents to the procedure.  The right chest wall was prepped with Betadine in a sterile fashion, and a sterile drape was applied covering the operative field.  A sterile gown and sterile gloves were used for the procedure. Local anesthesia was provided with 1% Lidocaine. Ultrasound image documentation was performed.  Fluoroscopy during the procedure and fluoroscopic spot radiograph confirms appropriate catheter position.  After creating a small skin incision, a 19 gauge needle was advanced into the pleural cavity under ultrasound guidance.  A guide wire was then advanced under fluoroscopy into the pleural space.  Pleural access was dilated serially and a 16-French peel- away sheath placed.  A 16 French tunneled Bard Aspira catheter was placed.  This was tunneled from an incision 5 cm below the pleural access to the access site.  The catheter was advanced through the peel-away sheath.  The sheath was then removed.  Final catheter positioning was confirmed with a fluoroscopic spot image.  The access incision was closed with subcutaneous 3-0 Monocryl and subcuticular 4-0 Vicryl.  Dermabond was applied to the incision.  A Prolene retention suture was applied at  the catheter exit site. Large volume thoracentesis was performed through the new catheter utilizing gravity drainage bag.  Complications:  None  Findings:  The catheter was placed via the right lateral chest wall.  Catheter course is towards the base of the pleural space. Approximately 2 liters of serosanguineous pleural fluid was able to be removed after catheter placement.  The patient will be  brought back in 10 to 14 days to remove the temporary exit site retention suture after appropriate in-growth around the subcutaneous cuff of the catheter.  IMPRESSION:  Placement of permanent, tunneled right pleural drainage catheter via lateral approach.  Two liters of pleural fluid were removed today after catheter placement.   Original Report Authenticated By: Irish Lack, M.D.    Nm Pet Image Initial (pi) Skull Base To Thigh  05/19/2012  *RADIOLOGY REPORT*  Clinical Data: Initial treatment strategy for metastatic adenocarcinoma,  unknown primary.  NUCLEAR MEDICINE PET SKULL BASE TO THIGH  Fasting Blood Glucose:  90  Technique:  16.2 mCi F-18 FDG was injected intravenously. CT data was obtained and used for attenuation correction and anatomic localization only.  (This was not acquired as a diagnostic CT examination.) Additional exam technical data entered on technologist worksheet.  Comparison:  CT thorax 04/26/2012  Findings:  Neck: No hypermetabolic lymph nodes in the neck.  Chest:  There is linear hypermetabolic activity associated with the atelectatic lateral segment of the right middle lobe.  This atelectatic lobe measures 6.2 x 2.3 cm (image 97) with SUV max = 4.9. The entire atelectatic lobe is not hypermetabolic indicating some focality.  There are no hypermetabolic mediastinal lymph nodes.  No significant metabolic activity associated with the moderate right effusion.  No hypermetabolic pulmonary nodules.  Pleural calcifications are noted within the left and right hemithorax.  Abdomen/Pelvis:  No abnormal hypermetabolic activity within the liver, pancreas, adrenal glands, or spleen.  No hypermetabolic lymph nodes in the abdomen or pelvis. The low density lesions superior aspect of the left hepatic lobe corresponds to a hemangioma on comparison CT and is not have associated metabolic activity.  Skeleton:  No focal hypermetabolic activity to suggest skeletal metastasis.  IMPRESSION:  1.  Linear  hypermetabolic activity within the atelectatic right middle lobe.  Differential includes neoplasm versus physiologic activity associated with atelectasis. The linear nature favors atelectasis; however the focality favors neoplasm.  2.  No additional evidence of primary neoplasm. 3.  Moderate right effusion.   Original Report Authenticated By: Genevive Bi, M.D.     Medications: I have reviewed the patient's current medications.  Assessment/Plan:  1. 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 Staging PET scan 05/19/2012 showed an area of hypermetabolic atelectatic lung in the right middle lobe and no other evidence of hypermetabolic activity. 2. Exertional dyspnea secondary to the large right pleural effusion status post placement of a Pleurx catheter 05/19/2012. The dyspnea is improved. 3. Status post bronchoscopy 05/31/2012 with no lesion in the right middle lobe. Right middle lobe bronchial brushings and bronchial lavage negative for malignant cells. 4. Status post aortic valve replacement. 5. History of atrial fibrillation. 6. Chronic back pain. 7. Osteoarthritis. 8. Chronic renal insufficiency. 9. BPH.  Disposition-Mr. Touch appears stable. The gene array assay returned most consistent with a diagnosis of mesothelioma. We reviewed that finding with Mr. Hepp and his wife and provided them with written information regarding mesothelioma. Dr. Truett Perna recommends an observation approach at present.  He will return for a followup visit in 2 weeks. He will contact the office in the interim with any problems. We requested that he keep a record of the dates the Pleurx catheter is drained and the amount of drainage.  Plan reviewed with Dr. Truett Perna.  Lonna Cobb ANP/GNP-BC

## 2012-06-08 NOTE — Telephone Encounter (Signed)
, °

## 2012-06-23 ENCOUNTER — Ambulatory Visit: Payer: Medicare Other | Admitting: Oncology

## 2012-06-23 ENCOUNTER — Telehealth: Payer: Self-pay | Admitting: Oncology

## 2012-06-23 NOTE — Telephone Encounter (Signed)
Pt called to r/s appt today , informed him that first orning for MD in [facility] [place], pt wants to come before then informed RN she will talk to MD, waiting for appt date

## 2012-06-27 ENCOUNTER — Telehealth: Payer: Self-pay | Admitting: *Deleted

## 2012-06-27 NOTE — Telephone Encounter (Signed)
Call from pt requesting to reschedule 06/23/12 he missed due to weather. Reviewed with Dr. Truett Perna, Order sent to schedulers. Called pt with appt for 07/03/12 at 1015.

## 2012-06-29 LAB — FUNGUS CULTURE W SMEAR: Special Requests: NORMAL

## 2012-07-03 ENCOUNTER — Ambulatory Visit (HOSPITAL_BASED_OUTPATIENT_CLINIC_OR_DEPARTMENT_OTHER): Payer: Medicare Other | Admitting: Oncology

## 2012-07-03 ENCOUNTER — Ambulatory Visit (HOSPITAL_COMMUNITY)
Admission: RE | Admit: 2012-07-03 | Discharge: 2012-07-03 | Disposition: A | Discharge status: Home / Self Care | Payer: Medicare Other | Source: Ambulatory Visit | Attending: Oncology | Admitting: Oncology

## 2012-07-03 ENCOUNTER — Telehealth: Payer: Self-pay | Admitting: Oncology

## 2012-07-03 VITALS — BP 118/59 | HR 68 | Temp 96.6°F | Resp 18 | Ht 66.0 in | Wt 179.2 lb

## 2012-07-03 DIAGNOSIS — N189 Chronic kidney disease, unspecified: Secondary | ICD-10-CM

## 2012-07-03 DIAGNOSIS — J984 Other disorders of lung: Secondary | ICD-10-CM | POA: Insufficient documentation

## 2012-07-03 DIAGNOSIS — C801 Malignant (primary) neoplasm, unspecified: Secondary | ICD-10-CM

## 2012-07-03 DIAGNOSIS — J90 Pleural effusion, not elsewhere classified: Secondary | ICD-10-CM

## 2012-07-03 DIAGNOSIS — J910 Malignant pleural effusion: Secondary | ICD-10-CM

## 2012-07-03 NOTE — Progress Notes (Signed)
Patient asking how often to change the dressing of his Pleuryx drain site? Reports there is no drainage noted with dressing changes. Asking if he could change dressing monthly? Confirmed with nurse at [facility] that dressing should be changed twice weekly. Patient notified.

## 2012-07-03 NOTE — Progress Notes (Signed)
   [facility]    OFFICE PROGRESS NOTE   INTERVAL HISTORY:   He returns as scheduled. He continues to drain the Pleurx catheter every 3 days. The catheter drained approximately 400-800 cc each time. On one occasion there was only 100 cc of drainage with a "clot ".he feels well. Good appetite and energy level.  Objective:  Vital signs in last 24 hours:  Blood pressure 118/59, pulse 68, temperature 96.6 F (35.9 C), temperature source Oral, resp. rate 18, height 5\' 6"  (1.676 m), weight 179 lb 3.2 oz (81.285 kg).    HEENT: neck without mass Lymphatics: no cervical, supraclavicular, axillary, or inguinal nodes Resp: lungs clear bilaterally, good air movement bilaterally Cardio: regular rate and rhythm GI: no hepatomegaly, nontender Vascular: no leg edema  Skin:Pleurx catheter skin accident without evidence of infection    Medications: I have reviewed the patient's current medications.  Assessment/Plan: 1. 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 Staging PET scan 05/19/2012 showed an area of hypermetabolic atelectatic lung in the right middle lobe and no other evidence of hypermetabolic activity.An unknown primary gene array assay returned most consistentwith a diagnosis of mesothelioma and additional immunohistochemical stains on the pleural cytology confirmed the malignant-appearing cells are of mesothelial origin. 2. Exertional dyspnea secondary to the large right pleural effusion status post placement of a Pleurx catheter 05/19/2012. The dyspnea is improved. 3. Status post bronchoscopy 05/31/2012 with no lesion in the right middle lobe. Right middle lobe bronchial brushings and bronchial lavage negative for malignant cells. 4. Status post aortic valve replacement. 5. History of atrial fibrillation. 6. Chronic back pain. 7. Osteoarthritis. 8. Chronic renal  insufficiency. 9. BPH.    Disposition:  He appears stable. The most likely diagnosis is malignant mesothelioma, but he understands a definitive diagnosis of mesothelioma is made on a tissue biopsy. We decided against referring him for a  Surgical biopsy since this would not change our treatment plan.  He will continue to drain the Pleurx catheter every 3 days. Mr.Hench will return for an office visit in 3-4 weeks. We will obtain a chest x-ray today and at the next office visit.   Thornton Papas, MD  07/03/2012  7:22 PM

## 2012-07-03 NOTE — Patient Instructions (Signed)
Per [facility]: Pleuryx dressing should be changed twice weekly.

## 2012-07-07 ENCOUNTER — Telehealth: Payer: Self-pay | Admitting: *Deleted

## 2012-07-07 NOTE — Telephone Encounter (Signed)
Message copied by Wandalee Ferdinand on Fri Jul 07, 2012  3:03 PM ------      Message from: Thornton Papas B      Created: Mon Jul 03, 2012  8:57 PM       Please call patient, cxr looks good, continue draining the pleurx every 3 days, call if the drainage is consistently less than 200cc ------

## 2012-07-07 NOTE — Telephone Encounter (Signed)
Notified patient that CXR looked good. Continue draining every 3 days and notify MD if drainage is consistently < 200 cc.

## 2012-07-14 ENCOUNTER — Ambulatory Visit: Payer: Medicare Other | Admitting: Oncology

## 2012-07-14 LAB — AFB CULTURE WITH SMEAR (NOT AT ARMC)

## 2012-07-19 ENCOUNTER — Telehealth: Payer: Self-pay | Admitting: *Deleted

## 2012-07-19 NOTE — Telephone Encounter (Signed)
[facility] has discharged him now, but he still needs to order supplies to drain his Plurex drain-Aspira cath kits. Supplier does not accept his insurance. Asking if our office is able to assist in getting supplies for him? Suggested she contact office of Dr. Marcelyn Bruins about this issue.

## 2012-07-24 ENCOUNTER — Telehealth: Payer: Self-pay | Admitting: Pulmonary Disease

## 2012-07-24 NOTE — Telephone Encounter (Signed)
ATC Amber, no answer. [id number]

## 2012-07-25 ENCOUNTER — Telehealth: Payer: Self-pay | Admitting: *Deleted

## 2012-07-25 DIAGNOSIS — J90 Pleural effusion, not elsewhere classified: Secondary | ICD-10-CM

## 2012-07-25 NOTE — Telephone Encounter (Signed)
I do not place or manage pleurx catheters.  Need to call the physician who placed the catheter.

## 2012-07-25 NOTE — Telephone Encounter (Signed)
Called spoke with [facility] w/ [facility] who reports that: The supplier called pt, does take insurance but will still cost pt $1000 and this is just for the kit, not including the separate dressing pack All local suppliers are either closed or don't even know what the ASPIRA is Per Amber, 3 ASPIRA drains have been donated to the patient to help but Dr Myrle Sheng is recommending [facility] seek [facility]'s recs  Dr Shelle Iron please advise, thanks!

## 2012-07-25 NOTE — Telephone Encounter (Signed)
Per Victorino Dike in IR: Call company --[employer] #[phone] option #1 and explain the issue and they can arrange for assistance for him. Patient needs to call.

## 2012-07-25 NOTE — Telephone Encounter (Signed)
Called patient and attempted to give him the customer service # for [employer]. He said he may not need it right now. Says "the lady from [facility] has called someone and they are sending me some bags". He will keep [place] informed of status. Still drains 400-850 cc each time he empties it. Going for his CXR tomorrow. Sees Dr. Truett Perna Thursday.

## 2012-07-25 NOTE — Telephone Encounter (Signed)
Spoke with Victorino Dike in interventional radiology department and made her aware of complication with getting supplies for his pleural cath that was placed on 05/12/12. She will follow up with company supply representative to see if there is a patient assistance program for this.

## 2012-07-25 NOTE — Addendum Note (Signed)
Addended by: Wandalee Ferdinand on: 07/25/2012 05:27 PM   Modules accepted: Orders

## 2012-07-25 NOTE — Telephone Encounter (Signed)
I called and made Amber aware of KC recs. She voiced her understanding and needed nothing further

## 2012-07-25 NOTE — Telephone Encounter (Signed)
Still unable to obtain Aspira supplies patient needs for cath care.

## 2012-07-26 ENCOUNTER — Telehealth: Payer: Self-pay | Admitting: *Deleted

## 2012-07-26 ENCOUNTER — Ambulatory Visit (HOSPITAL_COMMUNITY)
Admission: RE | Admit: 2012-07-26 | Discharge: 2012-07-26 | Disposition: A | Discharge status: Home / Self Care | Payer: Medicare Other | Source: Ambulatory Visit | Attending: Oncology | Admitting: Oncology

## 2012-07-26 DIAGNOSIS — R059 Cough, unspecified: Secondary | ICD-10-CM | POA: Insufficient documentation

## 2012-07-26 DIAGNOSIS — Z954 Presence of other heart-valve replacement: Secondary | ICD-10-CM | POA: Insufficient documentation

## 2012-07-26 DIAGNOSIS — R05 Cough: Secondary | ICD-10-CM | POA: Insufficient documentation

## 2012-07-26 DIAGNOSIS — J90 Pleural effusion, not elsewhere classified: Secondary | ICD-10-CM

## 2012-07-26 DIAGNOSIS — M549 Dorsalgia, unspecified: Secondary | ICD-10-CM | POA: Insufficient documentation

## 2012-07-26 NOTE — Telephone Encounter (Signed)
Pt will not need labs drawn 4/10. Attempted to call pt, no answer. Lab appt canceled.

## 2012-07-27 ENCOUNTER — Other Ambulatory Visit (HOSPITAL_COMMUNITY): Payer: Medicare Other

## 2012-07-27 ENCOUNTER — Telehealth: Payer: Self-pay | Admitting: Oncology

## 2012-07-27 ENCOUNTER — Ambulatory Visit (HOSPITAL_BASED_OUTPATIENT_CLINIC_OR_DEPARTMENT_OTHER): Payer: Medicare Other | Admitting: Nurse Practitioner

## 2012-07-27 ENCOUNTER — Other Ambulatory Visit: Payer: Medicare Other | Admitting: Lab

## 2012-07-27 VITALS — BP 110/51 | HR 75 | Temp 97.3°F | Resp 18 | Ht 66.0 in | Wt 176.4 lb

## 2012-07-27 DIAGNOSIS — J910 Malignant pleural effusion: Secondary | ICD-10-CM

## 2012-07-27 DIAGNOSIS — G8929 Other chronic pain: Secondary | ICD-10-CM

## 2012-07-27 DIAGNOSIS — J90 Pleural effusion, not elsewhere classified: Secondary | ICD-10-CM

## 2012-07-27 DIAGNOSIS — C801 Malignant (primary) neoplasm, unspecified: Secondary | ICD-10-CM

## 2012-07-27 NOTE — Progress Notes (Signed)
OFFICE PROGRESS NOTE  Interval history:  [name] turns as scheduled. Chest x-ray 07/26/2012 showed a small right pleural effusion. Indwelling pleural drainage noted. He continues to drain the Pleurx catheter every 3 days. Amounts range from 500-850. He continues to note dyspnea on exertion. He has an occasional cough. No fever. Appetite is "fine". He notes that he tires easily. He has intermittent dull pain medial to the right shoulder blade. He tends to note the pain after drainage of the Pleurx. He continues to have periodic "flushing" episodes. The episodes have been occurring over the past year.   Objective: Blood pressure 110/51, pulse 75, temperature 97.3 F (36.3 C), temperature source Oral, resp. rate 18, height 5\' 6"  (1.676 m), weight 176 lb 6.4 oz (80.015 kg).  Oropharynx is without thrush or ulceration. No palpable cervical, supraclavicular lymph nodes. Lungs are clear. Regular cardiac rhythm. Pleurx catheter right chest. He declined to have the dressing removed. Abdomen soft and nontender. No hepatomegaly. Bilateral 1+ pitting pretibial edema.  Lab Results: Lab Results  Component Value Date   WBC 9.1 05/18/2012   HGB 14.8 05/18/2012   HCT 42.9 05/18/2012   MCV 94.3 05/18/2012   PLT 210 05/18/2012    Chemistry:    Chemistry      Component Value Date/Time   NA 138 05/18/2012 1337   K 4.4 05/18/2012 1337   CL 105 05/18/2012 1337   CO2 24 05/18/2012 1337   BUN 25* 05/18/2012 1337   CREATININE 1.29 05/18/2012 1337      Component Value Date/Time   CALCIUM 8.9 05/18/2012 1337   ALKPHOS 81 06/07/2008 0934   AST 28 06/07/2008 0934   ALT 12 06/07/2008 0934   BILITOT 0.9 06/07/2008 0934       Studies/Results: Dg Chest 2 View  07/26/2012  *RADIOLOGY REPORT*  Clinical Data: Follow up pleural effusion, cough  CHEST - 2 VIEW  Comparison: 07/03/2012  Findings: Small right pleural effusion.  Indwelling pleural drain.  Lungs are essentially clear.  No pneumothorax.  The heart is normal in  size.  Prosthetic aortic valve.  Degenerative changes of the visualized thoracolumbar spine.  Lumbar spine fixation hardware.  IMPRESSION: Small right pleural effusion.  Indwelling pleural drain.   Original Report Authenticated By: Charline Bills, M.D.    Dg Chest 2 View  07/03/2012  *RADIOLOGY REPORT*  Clinical Data: Follow up right-sided pleural effusion, post Pleurx placement  CHEST - 2 VIEW  Comparison: 04/28/2012, 05/19/2012  Findings: The heart and pulmonary vascularity are within normal limits.  Postsurgical changes are again seen.  A Pleurx catheter is again noted on the right.  The previously noted right-sided pleural effusion has resolved.  No pneumothorax is seen.  The left lung is clear.  There is improved aeration in the medial aspect of the right middle lobe although some mild persistent density remains.  IMPRESSION: Resolution of right-sided pleural effusion post Pleurx placement.  Mild residual density within the right middle lobe.   Original Report Authenticated By: Alcide Clever, M.D.     Medications: I have reviewed the patient's current medications.  Assessment/Plan:  1. 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 Staging PET scan 05/19/2012 showed an area of hypermetabolic atelectatic lung in the right middle lobe and no other evidence of hypermetabolic activity.An unknown primary gene array assay returned most consistentwith a diagnosis of mesothelioma and additional immunohistochemical stains on the pleural cytology confirmed the  malignant-appearing cells are of mesothelial origin. 2. Exertional dyspnea secondary to the large right pleural effusion status post placement of a Pleurx catheter 05/19/2012. The dyspnea is improved. 3. Status post bronchoscopy 05/31/2012 with no lesion in the right middle lobe. Right middle lobe bronchial brushings and bronchial lavage negative for  malignant cells. 4. Status post aortic valve replacement. 5. History of atrial fibrillation. 6. Chronic back pain. 7. Osteoarthritis. 8. Chronic renal insufficiency. 9. BPH. 10. Intermittent "flushing" episodes over the past year. Question etiology.  Disposition-he appears stable. He will continue to drain the Pleurx catheter on the current schedule. He will return for a chest x-ray and followup visit in one month. Consider referral for a talc pleurodesis procedure if the Pleurx output does not decline over the next month. He will contact the office prior to his next visit with any problems.  Plan reviewed with Dr. Truett Perna.  Lonna Cobb ANP/GNP-BC

## 2012-07-27 NOTE — Progress Notes (Signed)
Pleurx cath drainage documentation from patient: 3/18 = 500 cc 3/21 = 500 cc 3/24 = 400 cc 3/27 = 850 cc  3/30 = 800 cc 4/02 = 600 cc 4/06 = 400 cc 4/09 = 500 cc

## 2012-07-27 NOTE — Telephone Encounter (Signed)
, °

## 2012-07-28 ENCOUNTER — Telehealth: Payer: Self-pay | Admitting: *Deleted

## 2012-07-28 NOTE — Telephone Encounter (Signed)
Faxed completed order form/script signed by MD with last progress note, procedure note of placement of drain and insurance/demographic information to [facility] as requested. Copy to HIM to scan in media.

## 2012-08-18 ENCOUNTER — Ambulatory Visit (HOSPITAL_COMMUNITY)
Admission: RE | Admit: 2012-08-18 | Discharge: 2012-08-18 | Disposition: A | Discharge status: Home / Self Care | Payer: Medicare Other | Source: Ambulatory Visit | Attending: Nurse Practitioner | Admitting: Nurse Practitioner

## 2012-08-18 DIAGNOSIS — J90 Pleural effusion, not elsewhere classified: Secondary | ICD-10-CM | POA: Insufficient documentation

## 2012-08-18 DIAGNOSIS — I509 Heart failure, unspecified: Secondary | ICD-10-CM | POA: Insufficient documentation

## 2012-08-18 DIAGNOSIS — C801 Malignant (primary) neoplasm, unspecified: Secondary | ICD-10-CM | POA: Insufficient documentation

## 2012-08-18 DIAGNOSIS — I2510 Atherosclerotic heart disease of native coronary artery without angina pectoris: Secondary | ICD-10-CM | POA: Insufficient documentation

## 2012-08-24 ENCOUNTER — Institutional Professional Consult (permissible substitution) (INDEPENDENT_AMBULATORY_CARE_PROVIDER_SITE_OTHER): Payer: Medicare Other | Admitting: Cardiothoracic Surgery

## 2012-08-24 ENCOUNTER — Telehealth: Payer: Self-pay | Admitting: Oncology

## 2012-08-24 ENCOUNTER — Telehealth: Payer: Self-pay | Admitting: *Deleted

## 2012-08-24 ENCOUNTER — Ambulatory Visit (HOSPITAL_BASED_OUTPATIENT_CLINIC_OR_DEPARTMENT_OTHER): Payer: Medicare Other | Admitting: Oncology

## 2012-08-24 VITALS — BP 111/55 | HR 77 | Temp 97.4°F | Resp 17 | Ht 66.0 in | Wt 177.3 lb

## 2012-08-24 DIAGNOSIS — J90 Pleural effusion, not elsewhere classified: Secondary | ICD-10-CM

## 2012-08-24 DIAGNOSIS — R0609 Other forms of dyspnea: Secondary | ICD-10-CM

## 2012-08-24 DIAGNOSIS — G8929 Other chronic pain: Secondary | ICD-10-CM

## 2012-08-24 DIAGNOSIS — M549 Dorsalgia, unspecified: Secondary | ICD-10-CM

## 2012-08-24 NOTE — Progress Notes (Signed)
[address]            [place] [address]          [phone]      SHAHZAD THOMANN [facility] Medical Record #[id number] Date of Birth: 06/20/1927  Referring: Dr Myrle Sheng Primary Care: Darnelle Bos, MD  Chief Complaint:   Malignant pleural effusion with Pleurx catheter in place right chest  History of Present Illness:      The patient is an 79 year old male  was in good state of health dec 2013  when he began to notice shortness of breath while swimming. He also noted cough as well as pleuritic chest pain, as well as a weight loss. A chest x-ray was done which showed a large right pleural effusion, and ultimately a thoracentesis revealed an exudative effusion with a low glucose. The pathology returned positive for adenocarcinoma. Immunohistochemical markers suggested mesothelioma. Because of a recurring effusion. The patient has had a Pleurx catheter in place since January.  There are also pleural calcifications, and the patient tells me he has worked in [employer] with exposure to asbestos. However, he did not handle the material directly. He has recently had a PET scan which showed hypermetabolic activity in the area of the right middle lobe atelectasis. The patient has been caring for his Pleurx himself at home training in approximately every 3 days between 300 -600 ml. the most recent to drainage periods were 400 mL's each.  Was asked by Dr. Truett Perna to see the patient to assist in the care of his Pleurx catheter and also to offer suggestions for treatment of his persistent drainage.     Current Activity/ Functional Status:  Patient is independent with mobility/ambulation, transfers, ADL's, IADL's.  Zubrod Score: At the time of surgery this patient's most appropriate activity status/level should be described as: []  Normal activity, no symptoms []  Symptoms, fully ambulatory []  Symptoms, in bed less than or equal to 50% of the  time []  Symptoms, in bed greater than 50% of the time but less than 100% []  Bedridden []  Moribund   Past Medical History  Diagnosis Date  . Hypothyroidism     takes Levothyroxine  . Coronary artery disease     valve replaced  . Sleep apnea 2011    does not use CPAP  . Seizures     cannot remember last seizure  . Arthritis     all over body  . Anxiety     comes and goes  . Depression     comes and goes  . Dysrhythmia     recurrent arrhythmia w/AV replacement  . CHF (congestive heart failure) 02/10/11 note    has compensated CHF per Dr. Katrinka Blazing note  . Constipation 08/2009    after surgery  . Urinary tract bacterial infections 08/2010    from surgery and catheters    Past Surgical History  Procedure Laterality Date  . Cardiac valve replacement  2001  . Back surgery  2011-last one    6 surgeries  . Cardiac catheterization  2001    before valve replacement  . Joint replacement  08/2010    right knee  . Joint replacement  2004    left knee  . Transurethral incision of prostate  03/02/2011    Procedure: TRANSURETHRAL INCISION OF THE PROSTATE (TUIP);  Surgeon: [name];  Location: [facility];  Service: Urology;  Laterality: N/A;  . Esophageal biopsy  03/02/2011    Procedure: BIOPSY;  Surgeon: [name];  Location: [facility];  Service: Urology;;  . Video bronchoscopy Bilateral 05/31/2012    Procedure: VIDEO BRONCHOSCOPY WITHOUT FLUORO;  Surgeon: Barbaraann Share, MD;  Location: [facility];  Service: Cardiopulmonary;  Laterality: Bilateral;    No family history on file.  History   Social History  . Marital Status: Married    Spouse Name: N/A    Number of Children: N/A  . Years of Education: N/A   Occupational History  . Not on file.   Social History Main Topics  . Smoking status: Former Smoker -- 1.00 packs/day for 30 years    Types: Cigarettes    Quit date: 02/23/1984  . Smokeless tobacco: Not on file  . Alcohol Use: No  . Drug Use: No  . Sexually Active:     Other Topics Concern  . Not on file   Social History Narrative  . No narrative on file    History  Smoking status  . Former Smoker -- 1.00 packs/day for 30 years  . Types: Cigarettes  . Quit date: 02/23/1984  Smokeless tobacco  . Not on file    History  Alcohol Use No     Allergies  Allergen Reactions  . Penicillins Other (See Comments)    Pt doesn't remember the reaction  . Warfarin And Related Other (See Comments)    Pt gets the shakes    Current Outpatient Prescriptions  Medication Sig Dispense Refill  . amiodarone (PACERONE) 200 MG tablet Take 100 mg by mouth daily. Pt takes (0.5 tab) for 100mg  dosage      . atorvastatin (LIPITOR) 10 MG tablet Take 10 mg by mouth every morning.      . calcium-vitamin D (OSCAL WITH D) 500-200 MG-UNIT per tablet Take 1 tablet by mouth daily.      . clonazePAM (KLONOPIN) 0.5 MG tablet Take 0.5 mg by mouth 2 (two) times daily as needed. For anxiety      . dabigatran (PRADAXA) 150 MG CAPS Take 150 mg by mouth every 12 (twelve) hours.       Marland Kitchen HYDROcodone-acetaminophen (NORCO/VICODIN) 5-325 MG per tablet Take 1 tablet by mouth every 6 (six) hours as needed for pain.  20 tablet  0  . levothyroxine (SYNTHROID, LEVOTHROID) 50 MCG tablet Take 50 mcg by mouth every morning.       . metoprolol (TOPROL-XL) 50 MG 24 hr tablet Take 50 mg by mouth every morning.       . mirabegron ER (MYRBETRIQ) 50 MG TB24 Take 50 mg by mouth daily.      . Misc Natural Products (OSTEO BI-FLEX ADV DOUBLE ST PO) Take 1 tablet by mouth 2 (two) times daily.       . phenytoin (DILANTIN) 100 MG ER capsule Take 200 mg by mouth 2 (two) times daily.       Bertram Gala Glycol-Propyl Glycol (SYSTANE OP) Apply 1 drop to eye as needed. For dry eyes      . polyethylene glycol (MIRALAX / GLYCOLAX) packet Take 17 g by mouth daily.      Marland Kitchen rOPINIRole (REQUIP) 0.5 MG tablet Take 0.5 mg by mouth 3 (three) times daily.       . sertraline (ZOLOFT) 50 MG tablet Take 50 mg by mouth every  evening.       . vitamin C (ASCORBIC ACID) 500 MG tablet Take 500 mg by mouth daily.  No current facility-administered medications for this visit.       Review of Systems:     Cardiac Review of Systems: Y or N  Chest Pain [    ]  Resting SOB [   ] Exertional SOB  [  ]  Orthopnea [  ]   Pedal Edema [   ]    Palpitations [  ] Syncope  [  ]   Presyncope [   ]  General Review of Systems: [Y] = yes [  ]=no Constitional: recent weight change [  ]; anorexia [  ]; fatigue [  ]; nausea [  ]; night sweats [  ]; fever [  ]; or chills [  ];                                                                                                                                          Dental: poor dentition[  ]; Last Dentist visit:   Eye : blurred vision [  ]; diplopia [   ]; vision changes [  ];  Amaurosis fugax[  ]; Resp: cough [  ];  wheezing[  ];  hemoptysis[  ]; shortness of breath[  ]; paroxysmal nocturnal dyspnea[  ]; dyspnea on exertion[  ]; or orthopnea[  ];  GI:  gallstones[  ], vomiting[  ];  dysphagia[  ]; melena[  ];  hematochezia [  ]; heartburn[  ];   Hx of  Colonoscopy[  ]; GU: kidney stones [  ]; hematuria[  ];   dysuria [  ];  nocturia[  ];  history of     obstruction [  ]; urinary frequency [  ]             Skin: rash, swelling[  ];, hair loss[  ];  peripheral edema[  ];  or itching[  ]; Musculosketetal: myalgias[  ];  joint swelling[  ];  joint erythema[  ];  joint pain[  ];  back pain[  ];  Heme/Lymph: bruising[  ];  bleeding[  ];  anemia[  ];  Neuro: TIA[  ];  headaches[  ];  stroke[  ];  vertigo[  ];  seizures[  ];   paresthesias[  ];  difficulty walking[  ];  Psych:depression[  ]; anxiety[  ];  Endocrine: diabetes[  ];  thyroid dysfunction[  ];  Immunizations: Flu [  ]; Pneumococcal[  ];  Other:  Physical Exam: There were no vitals taken for this visit.  General appearance: alert, cooperative, appears stated age and no distress Neurologic: intact Heart: regular rate and  rhythm, S1, S2 normal, no murmur, click, rub or gallop and normal apical impulse Lungs: clear to auscultation bilaterally Abdomen: soft, non-tender; bowel sounds normal; no masses,  no organomegaly Extremities: extremities normal, atraumatic, no cyanosis or edema and Homans sign is negative, no sign of DVT Patient has no cervical supraclavicular  or axillary adenopathy, he has no pedal edema, pedal pulses are intact bilaterally   Diagnostic Studies & Laboratory data:     Recent Radiology Findings:  Dg Chest 2 View  08/18/2012  *RADIOLOGY REPORT*  Clinical Data: Follow-up pleural effusions  CHEST - 2 VIEW  Comparison: Chest radiograph 07/26/2012  Findings: Sternotomy wires overlie normal cardiac silhouette.  The right chest tube in place with tip in the mid right hemithorax.  No evidence of pneumothorax.  Small right effusion noted.  IMPRESSION: Right chest tube in place without evidence of pneumothorax.  Small right effusion.   Original Report Authenticated By: Genevive Bi, M.D.    Dg Chest 2 View  07/26/2012  *RADIOLOGY REPORT*  Clinical Data: Follow up pleural effusion, cough  CHEST - 2 VIEW  Comparison: 07/03/2012  Findings: Small right pleural effusion.  Indwelling pleural drain.  Lungs are essentially clear.  No pneumothorax.  The heart is normal in size.  Prosthetic aortic valve.  Degenerative changes of the visualized thoracolumbar spine.  Lumbar spine fixation hardware.  IMPRESSION: Small right pleural effusion.  Indwelling pleural drain.   Original Report Authenticated By: Charline Bills, M.D.        Recent Lab Findings: Lab Results  Component Value Date   WBC 9.1 05/18/2012   HGB 14.8 05/18/2012   HCT 42.9 05/18/2012   PLT 210 05/18/2012   GLUCOSE 83 05/18/2012   ALT 12 06/07/2008   AST 28 06/07/2008   NA 138 05/18/2012   K 4.4 05/18/2012   CL 105 05/18/2012   CREATININE 1.29 05/18/2012   BUN 25* 05/18/2012   CO2 24 05/18/2012   TSH 7.497* 06/17/2010   INR 1.27 05/18/2012       Assessment / Plan:    Patient is an 79 year old active male now with persistent drainage from right malignant pleural effusion. I have reviewed with the patient the options of continuing with current therapy, proceeding with a VATS and talc pleurodesis, or placing talc slurry in the current Pleurx catheter and right pleural space. The patient was very them and that he did not want any hospitalization or major interventions. At this point we will proceed with increasing the interval of drainage and see how this affects her total amount, if there is some decrease he is willing to consider talc slurry by the current tube. He will increase the interval of drainage to every 5 days from every 3 unless he notes increasing shortness of breath. I will see him back with a chest x-ray in 3 weeks and we will further consider talc instillation into the Pleurx depending on amount of drainage.       Delight Ovens MD  Beeper [phone] Office [phone] 08/24/2012 5:27 PM

## 2012-08-24 NOTE — Progress Notes (Signed)
Pleuryx Drain Output Log documentation by patient: 4/12 = 300cc 4/15 = 500cc 4/18 = 500cc 4/21 = 500+cc 4/24 = 600cc 4/27= 300cc 4/30 = 600cc 5/3   = 400cc 5/6   = 400+cc

## 2012-08-24 NOTE — Progress Notes (Signed)
   [facility]    OFFICE PROGRESS NOTE   INTERVAL HISTORY:   He returns as scheduled. He reports feeling well. He exercises. No pain or dyspnea.  The Pleurx catheter continues to drain approximately 500 cc every 3 days.  Objective:  Vital signs in last 24 hours:  Blood pressure 111/55, pulse 77, temperature 97.4 F (36.3 C), temperature source Oral, resp. rate 17, height 5\' 6"  (1.676 m), weight 177 lb 4.8 oz (80.423 kg).    HEENT: Neck without mass Lymphatics: No cervical, supraclavicular, or axillary nodes. Prominent bilateral x-ray fat pads. Resp: End inspiratory rales at the right base, no respiratory distress, good air movement bilaterally, Pleurx catheter site without evidence of infection Cardio: Regular rate and rhythm GI: Nontender, no hepatomegaly Vascular: No leg edema   Lab Results:  Lab Results  Component Value Date   WBC 9.1 05/18/2012   HGB 14.8 05/18/2012   HCT 42.9 05/18/2012   MCV 94.3 05/18/2012   PLT 210 05/18/2012   X-rays: Chest x-ray 08/18/2012-right chest tube in place without evidence of pneumothorax. Small right effusion.   Medications: I have reviewed the patient's current medications.  Assessment/Plan: 1. 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 Staging PET scan 05/19/2012 showed an area of hypermetabolic atelectatic lung in the right middle lobe and no other evidence of hypermetabolic activity.An unknown primary gene array assay returned most consistentwith a diagnosis of mesothelioma and additional immunohistochemical stains on the pleural cytology confirmed the malignant-appearing cells are of mesothelial origin. 2. Exertional dyspnea secondary to the large right pleural effusion status post placement of a Pleurx catheter 05/19/2012. The dyspnea is improved. 3. Status post bronchoscopy 05/31/2012 with no lesion in the  right middle lobe. Right middle lobe bronchial brushings and bronchial lavage negative for malignant cells. 4. Status post aortic valve replacement. 5. History of atrial fibrillation. 6. Chronic back pain. 7. Osteoarthritis. 8. Chronic renal insufficiency. 9. BPH. 10. Intermittent "flushing" episodes over the past year. Question etiology.  Disposition:  He appears stable. The Pleurx catheter continues to drain a large amount 3 days per week. I will consult cardiothoracic surgery to help with management of the Pleurx catheter. He may be a candidate for a talc pleurodesis. He plans to take a vacation to [place] in late June and would like to have the catheter out by then. He will return for an office visit and repeat chest x-ray in approximately one month.   Thornton Papas, MD  08/24/2012  11:44 AM

## 2012-08-24 NOTE — Telephone Encounter (Signed)
Left message on home phone regarding appt for Dr. Tyrone Sage today at 1:30

## 2012-08-24 NOTE — Telephone Encounter (Signed)
Called pt again no answer

## 2012-09-13 ENCOUNTER — Other Ambulatory Visit: Payer: Self-pay | Admitting: *Deleted

## 2012-09-13 ENCOUNTER — Ambulatory Visit
Admission: RE | Admit: 2012-09-13 | Discharge: 2012-09-13 | Disposition: A | Discharge status: Home / Self Care | Payer: Medicare Other | Source: Ambulatory Visit | Attending: Cardiothoracic Surgery | Admitting: Cardiothoracic Surgery

## 2012-09-13 DIAGNOSIS — J90 Pleural effusion, not elsewhere classified: Secondary | ICD-10-CM

## 2012-09-14 ENCOUNTER — Ambulatory Visit (INDEPENDENT_AMBULATORY_CARE_PROVIDER_SITE_OTHER): Payer: Medicare Other | Admitting: Cardiothoracic Surgery

## 2012-09-14 ENCOUNTER — Encounter: Payer: Self-pay | Admitting: Cardiothoracic Surgery

## 2012-09-14 VITALS — BP 93/57 | HR 67 | Resp 16 | Ht 66.0 in | Wt 170.0 lb

## 2012-09-14 DIAGNOSIS — J910 Malignant pleural effusion: Secondary | ICD-10-CM

## 2012-09-14 NOTE — Progress Notes (Signed)
[address]       [place] [address]             [phone]               [name] [facility] Medical Record #[id number] Date of Birth: 1927/07/09  Referring: Dr Myrle Sheng Primary Care: Darnelle Bos, MD  Chief Complaint:   Malignant pleural effusion with Pleurx catheter in place right chest  History of Present Illness:      The patient is an 79 year old male  was in good state of health dec 2013  when he began to notice shortness of breath while swimming. He also noted cough as well as pleuritic chest pain, as well as a weight loss. A chest x-ray was done which showed a large right pleural effusion, and ultimately a thoracentesis revealed an exudative effusion with a low glucose. The pathology returned positive for adenocarcinoma. Immunohistochemical markers suggested mesothelioma. Because of a recurring effusion. The patient has had a Pleurx catheter in place since January.  There are also pleural calcifications, and the patient tells me he has worked in [employer] with exposure to asbestos. However, he did not handle the material directly. He has recently had a PET scan which showed hypermetabolic activity in the area of the right middle lobe atelectasis. The patient has been caring for his Pleurx himself at home training in approximately every 3 days between 300 -600 ml. the most recent to drainage periods were 400 mL's each.  Was asked by Dr. Truett Perna to see the patient to assist in the care of his Pleurx catheter and also to offer suggestions for treatment of his persistent drainage.     Current Activity/ Functional Status:  Patient is independent with mobility/ambulation, transfers, ADL's, IADL's.  Zubrod Score: At the time of surgery this patient's most appropriate activity status/level should be described as: []  Normal activity, no symptoms [x]  Symptoms, fully ambulatory []  Symptoms, in bed less than or equal to 50% of the  time []  Symptoms, in bed greater than 50% of the time but less than 100% []  Bedridden []  Moribund   Past Medical History  Diagnosis Date  . Hypothyroidism     takes Levothyroxine  . Coronary artery disease     valve replaced  . Sleep apnea 2011    does not use CPAP  . Seizures     cannot remember last seizure  . Arthritis     all over body  . Anxiety     comes and goes  . Depression     comes and goes  . Dysrhythmia     recurrent arrhythmia w/AV replacement  . CHF (congestive heart failure) 02/10/11 note    has compensated CHF per Dr. Katrinka Blazing note  . Constipation 08/2009    after surgery  . Urinary tract bacterial infections 08/2010    from surgery and catheters    Past Surgical History  Procedure Laterality Date  . Cardiac valve replacement  2001  . Back surgery  2011-last one    6 surgeries  . Cardiac catheterization  2001    before valve replacement  . Joint replacement  08/2010    right knee  . Joint replacement  2004    left knee  . Transurethral incision of prostate  03/02/2011    Procedure: TRANSURETHRAL INCISION OF THE PROSTATE (TUIP);  Surgeon: [name];  Location: [facility];  Service: Urology;  Laterality: N/A;  . Esophageal biopsy  03/02/2011    Procedure: BIOPSY;  Surgeon: [name];  Location: [facility];  Service: Urology;;  . Video bronchoscopy Bilateral 05/31/2012    Procedure: VIDEO BRONCHOSCOPY WITHOUT FLUORO;  Surgeon: Barbaraann Share, MD;  Location: [facility];  Service: Cardiopulmonary;  Laterality: Bilateral;    No family history on file.  History   Social History  . Marital Status: Married    Spouse Name: N/A    Number of Children: N/A  . Years of Education: N/A   Occupational History  . Not on file.   Social History Main Topics  . Smoking status: Former Smoker -- 1.00 packs/day for 30 years    Types: Cigarettes    Quit date: 02/23/1984  . Smokeless tobacco: Not on file  . Alcohol Use: No  . Drug Use: No  . Sexually Active:     Other Topics Concern  . Not on file   Social History Narrative  . No narrative on file    History  Smoking status  . Former Smoker -- 1.00 packs/day for 30 years  . Types: Cigarettes  . Quit date: 02/23/1984  Smokeless tobacco  . Not on file    History  Alcohol Use No     Allergies  Allergen Reactions  . Penicillins Other (See Comments)    Pt doesn't remember the reaction  . Warfarin And Related Other (See Comments)    Pt gets the shakes    Current Outpatient Prescriptions  Medication Sig Dispense Refill  . amiodarone (PACERONE) 200 MG tablet Take 100 mg by mouth daily. Pt takes (0.5 tab) for 100mg  dosage      . atorvastatin (LIPITOR) 10 MG tablet Take 10 mg by mouth every morning.      . calcium-vitamin D (OSCAL WITH D) 500-200 MG-UNIT per tablet Take 1 tablet by mouth daily.      . clonazePAM (KLONOPIN) 0.5 MG tablet Take 0.5 mg by mouth 2 (two) times daily as needed. For anxiety      . dabigatran (PRADAXA) 150 MG CAPS Take 150 mg by mouth every 12 (twelve) hours.       Marland Kitchen HYDROcodone-acetaminophen (NORCO/VICODIN) 5-325 MG per tablet Take 1 tablet by mouth every 6 (six) hours as needed for pain.  20 tablet  0  . levothyroxine (SYNTHROID, LEVOTHROID) 50 MCG tablet Take 50 mcg by mouth every morning.       . metoprolol (TOPROL-XL) 50 MG 24 hr tablet Take 50 mg by mouth every morning.       . mirabegron ER (MYRBETRIQ) 50 MG TB24 Take 50 mg by mouth daily.      . Misc Natural Products (OSTEO BI-FLEX ADV DOUBLE ST PO) Take 1 tablet by mouth 2 (two) times daily.       . phenytoin (DILANTIN) 100 MG ER capsule Take 200 mg by mouth 2 (two) times daily.       Bertram Gala Glycol-Propyl Glycol (SYSTANE OP) Apply 1 drop to eye as needed. For dry eyes      . polyethylene glycol (MIRALAX / GLYCOLAX) packet Take 17 g by mouth daily.      Marland Kitchen rOPINIRole (REQUIP) 0.5 MG tablet Take 0.5 mg by mouth 3 (three) times daily.       . sertraline (ZOLOFT) 50 MG tablet Take 50 mg by mouth every  evening.       . vitamin C (ASCORBIC ACID) 500 MG tablet Take 500 mg by mouth daily.       No  current facility-administered medications for this visit.      Physical Exam: BP 93/57  Pulse 67  Resp 16  Ht 5\' 6"  (1.676 m)  Wt 170 lb (77.111 kg)  BMI 27.45 kg/m2  SpO2 98%  General appearance: alert, cooperative, appears stated age and no distress Neurologic: intact Heart: regular rate and rhythm, S1, S2 normal, no murmur, click, rub or gallop and normal apical impulse Lungs: clear to auscultation bilaterally Abdomen: soft, non-tender; bowel sounds normal; no masses,  no organomegaly Extremities: extremities normal, atraumatic, no cyanosis or edema and Homans sign is negative, no sign of DVT Patient has no cervical supraclavicular or axillary adenopathy, he has no pedal edema, pedal pulses are intact bilaterally   Diagnostic Studies & Laboratory data:     Recent Radiology Findings:   Dg Chest 2 View  09/13/2012   *RADIOLOGY REPORT*  Clinical Data: Pleural effusion.  CHEST - 2 VIEW  Comparison: 08/18/2012  Findings: Right Pleurx catheter in place within the right lower hemithorax.  Small right pleural effusion.  No visible pneumothorax.  Heart is borderline in size.  No confluent airspace opacities.  No acute bony abnormality.  Chronic mild compression fractures in the mid thoracic spine.  IMPRESSION: Right Pleurx catheter in place with small right pleural effusion. No pneumothorax.   Original Report Authenticated By: Charlett Nose, M.D.   Dg Chest 2 View  08/18/2012  *RADIOLOGY REPORT*  Clinical Data: Follow-up pleural effusions  CHEST - 2 VIEW  Comparison: Chest radiograph 07/26/2012  Findings: Sternotomy wires overlie normal cardiac silhouette.  The right chest tube in place with tip in the mid right hemithorax.  No evidence of pneumothorax.  Small right effusion noted.  IMPRESSION: Right chest tube in place without evidence of pneumothorax.  Small right effusion.   Original Report  Authenticated By: Genevive Bi, M.D.    Dg Chest 2 View  07/26/2012  *RADIOLOGY REPORT*  Clinical Data: Follow up pleural effusion, cough  CHEST - 2 VIEW  Comparison: 07/03/2012  Findings: Small right pleural effusion.  Indwelling pleural drain.  Lungs are essentially clear.  No pneumothorax.  The heart is normal in size.  Prosthetic aortic valve.  Degenerative changes of the visualized thoracolumbar spine.  Lumbar spine fixation hardware.  IMPRESSION: Small right pleural effusion.  Indwelling pleural drain.   Original Report Authenticated By: Charline Bills, M.D.        Recent Lab Findings: Lab Results  Component Value Date   WBC 9.1 05/18/2012   HGB 14.8 05/18/2012   HCT 42.9 05/18/2012   PLT 210 05/18/2012   GLUCOSE 83 05/18/2012   ALT 12 06/07/2008   AST 28 06/07/2008   NA 138 05/18/2012   K 4.4 05/18/2012   CL 105 05/18/2012   CREATININE 1.29 05/18/2012   BUN 25* 05/18/2012   CO2 24 05/18/2012   TSH 7.497* 06/17/2010   INR 1.27 05/18/2012      Assessment / Plan:    Patient is an 79 year old active male now with persistent drainage from right malignant pleural effusion. I have reviewed with the patient the options of continuing with current therapy, proceeding with a VATS and talc pleurodesis, or placing talc slurry in the current Pleurx catheter and right pleural space. We have increased his pleural drainage to once every 5 days, is now draining between 600 and 700 ml.  His blood pressure has been noted by the home nurse and in the office to run in the high 90s, currently on Toprol 50 mg a  day. He will stop by Dr. Corky Sing office today with his blood pressure recordings to discuss with him changing his Toprol XL dosage.  I've offered to the patient proceeding with a right that's and talc produces, but he is not interested in any invasive procedures. We will continue drainage of his Pleurx q. 5 days, if the amount drops further we'll consider talc slurry in the Pleurx. I plan to see  him back in 3 weeks, he plans to spend the month of July in [place].   Delight Ovens MD  Beeper [phone] Office [phone] 09/14/2012 12:23 PM

## 2012-09-28 ENCOUNTER — Ambulatory Visit (HOSPITAL_BASED_OUTPATIENT_CLINIC_OR_DEPARTMENT_OTHER): Payer: Medicare Other | Admitting: Oncology

## 2012-09-28 ENCOUNTER — Telehealth: Payer: Self-pay | Admitting: Oncology

## 2012-09-28 VITALS — BP 117/70 | HR 77 | Temp 96.5°F | Resp 20 | Ht 66.0 in | Wt 174.6 lb

## 2012-09-28 DIAGNOSIS — G8929 Other chronic pain: Secondary | ICD-10-CM

## 2012-09-28 DIAGNOSIS — F329 Major depressive disorder, single episode, unspecified: Secondary | ICD-10-CM

## 2012-09-28 DIAGNOSIS — C801 Malignant (primary) neoplasm, unspecified: Secondary | ICD-10-CM

## 2012-09-28 DIAGNOSIS — J910 Malignant pleural effusion: Secondary | ICD-10-CM

## 2012-09-28 DIAGNOSIS — J90 Pleural effusion, not elsewhere classified: Secondary | ICD-10-CM

## 2012-09-28 NOTE — Progress Notes (Signed)
Recent Pleuryx drainage totals per patient: Drains every 5 days 5/16  600 cc  5/21  650 cc 5/26  700 cc  5/31  800 cc 6/5    650 cc  6/10  400+ cc

## 2012-09-28 NOTE — Telephone Encounter (Signed)
gv and pritned appt sched and avs for pt....pt aware to go to radiology

## 2012-09-28 NOTE — Progress Notes (Signed)
   [name]    OFFICE PROGRESS NOTE   INTERVAL HISTORY:   [name] returns as scheduled. [name] is now draining the Pleurx catheter every 5 days. Approximately 400-800 cc is drained each time. [name] feels well physically. [name] is going about his usual activities. [name] feels "depressed ". [name] reports his wife had recent hip surgery and developed worsened dementia following this procedure. [name] plans a trip to [place] for a family gathering later this month. [name] reports Dr. Earl Gala recently lowered the metoprolol dose in an attempt to improve the "flushing" episodes.  Objective:  Vital signs in last 24 hours:  Blood pressure 117/70, pulse 77, temperature 96.5 F (35.8 C), temperature source Oral, resp. rate 20, height 5\' 6"  (1.676 m), weight 174 lb 9.6 oz (79.198 kg).    HEENT: Neck without mass Lymphatics: No cervical, supraclavicular, or axillary node Resp: Lungs clear bilaterally, right chest tube site without evidence of infection Cardio: Regular rate and rhythm GI: No hepatomegaly Vascular: No leg edema, bilateral low leg varicosities  X-rays: Chest x-ray 09/13/2012-small right pleural effusion. No pneumothorax.   Medications: I have reviewed the patient's current medications.  Assessment/Plan: 1. 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 Staging PET scan 05/19/2012 showed an area of hypermetabolic atelectatic lung in the right middle lobe and no other evidence of hypermetabolic activity.An unknown primary gene array assay returned most consistentwith a diagnosis of mesothelioma and additional immunohistochemical stains on the pleural cytology confirmed the malignant-appearing cells are of mesothelial origin. 2. Exertional dyspnea secondary to the large right pleural effusion status post placement of a Pleurx catheter 05/19/2012. The dyspnea is improved. The Pleurx  catheter continues to have a significant output. 3. Status post bronchoscopy 05/31/2012 with no lesion in the right middle lobe. Right middle lobe bronchial brushings and bronchial lavage negative for malignant cells. 4. Status post aortic valve replacement. 5. History of atrial fibrillation. 6. Chronic back pain. 7. Osteoarthritis. 8. Chronic renal insufficiency. 9. BPH. 10. Intermittent "flushing" episodes over the past year. Question etiology. 11. Depression-taking Zoloft  Disposition:  The Pleurx catheter continues to have a significant output. [name] is scheduled to see Dr. Tyrone Sage next week to discuss treatment options. [name] may be a candidate for a talc pleurodesed or VATS procedure. [name] is planning a 6 week trip to [place] later this month. The Pleurx catheter will likely remain in place. [name] will return for an office visit here in August. [name] knows to contact us in the interim for new symptoms.   William Papas, MD  09/28/2012  5:03 PM

## 2012-10-02 ENCOUNTER — Other Ambulatory Visit: Payer: Self-pay | Admitting: *Deleted

## 2012-10-05 ENCOUNTER — Encounter: Payer: Self-pay | Admitting: Cardiothoracic Surgery

## 2012-10-05 ENCOUNTER — Ambulatory Visit
Admission: RE | Admit: 2012-10-05 | Discharge: 2012-10-05 | Disposition: A | Discharge status: Home / Self Care | Payer: Medicare Other | Source: Ambulatory Visit | Attending: Oncology | Admitting: Oncology

## 2012-10-05 ENCOUNTER — Ambulatory Visit
Admission: RE | Admit: 2012-10-05 | Discharge: 2012-10-05 | Disposition: A | Discharge status: Home / Self Care | Payer: Medicare Other | Source: Ambulatory Visit | Attending: Cardiothoracic Surgery | Admitting: Cardiothoracic Surgery

## 2012-10-05 ENCOUNTER — Ambulatory Visit (INDEPENDENT_AMBULATORY_CARE_PROVIDER_SITE_OTHER): Payer: Medicare Other | Admitting: Cardiothoracic Surgery

## 2012-10-05 ENCOUNTER — Other Ambulatory Visit: Payer: Self-pay | Admitting: *Deleted

## 2012-10-05 VITALS — BP 107/60 | HR 76 | Resp 16 | Ht 66.0 in | Wt 160.0 lb

## 2012-10-05 DIAGNOSIS — J90 Pleural effusion, not elsewhere classified: Secondary | ICD-10-CM

## 2012-10-05 DIAGNOSIS — Z09 Encounter for follow-up examination after completed treatment for conditions other than malignant neoplasm: Secondary | ICD-10-CM

## 2012-10-05 DIAGNOSIS — J910 Malignant pleural effusion: Secondary | ICD-10-CM

## 2012-10-05 MED ORDER — HYDROCODONE-ACETAMINOPHEN 5-325 MG PO TABS: 1.0000 | ORAL_TABLET | Freq: Four times a day (QID) | ORAL | Status: DC | PRN

## 2012-10-05 NOTE — Progress Notes (Signed)
[address]       [place] [address]             [phone]               PAXTEN APPELT [facility] Medical Record #[id number] Date of Birth: [birth year]  Referring: Dr Myrle Sheng Primary Care: Darnelle Bos, MD  Chief Complaint:   Malignant pleural effusion with Pleurx catheter in place right chest  History of Present Illness:      The patient is an 79 year old male  was in good state of health dec 2013  when he began to notice shortness of breath while swimming. He also noted cough as well as pleuritic chest pain, as well as a weight loss. A chest x-ray was done which showed a large right pleural effusion, and ultimately a thoracentesis revealed an exudative effusion with a low glucose. The pathology returned positive for adenocarcinoma. Immunohistochemical markers suggested mesothelioma. Because of a recurring effusion. The patient has had a Pleurx catheter in place since January.  There are also pleural calcifications, and the patient tells me he has worked in [employer] with exposure to asbestos. However, he did not handle the material directly. He has recently had a PET scan which showed hypermetabolic activity in the area of the right middle lobe atelectasis.   Patient does complain of some discomfort around the right lower chest especially early after draining the fluid. He is hesitant about taking pain medicine because of the fear of constipation. Currently he is draining the catheter once every 5 days and has been draining approximate 600 cc over that time interval. Chest x-ray done today shows clear lung fields bilaterally without persistent significant effusion.  Current Activity/ Functional Status:  Patient is independent with mobility/ambulation, transfers, ADL's, IADL's.  Zubrod Score: At the time of surgery this patient's most appropriate activity status/level should be described as: []  Normal activity, no symptoms [x]  Symptoms, fully  ambulatory []  Symptoms, in bed less than or equal to 50% of the time []  Symptoms, in bed greater than 50% of the time but less than 100% []  Bedridden []  Moribund   Past Medical History  Diagnosis Date  . Hypothyroidism     takes Levothyroxine  . Coronary artery disease     valve replaced  . Sleep apnea 2011    does not use CPAP  . Seizures     cannot remember last seizure  . Arthritis     all over body  . Anxiety     comes and goes  . Depression     comes and goes  . Dysrhythmia     recurrent arrhythmia w/AV replacement  . CHF (congestive heart failure) 02/10/11 note    has compensated CHF per Dr. Katrinka Blazing note  . Constipation 08/2009    after surgery  . Urinary tract bacterial infections 08/2010    from surgery and catheters    Past Surgical History  Procedure Laterality Date  . Cardiac valve replacement  2001  . Back surgery  2011-last one    6 surgeries  . Cardiac catheterization  2001    before valve replacement  . Joint replacement  08/2010    right knee  . Joint replacement  2004    left knee  . Transurethral incision of prostate  03/02/2011    Procedure: TRANSURETHRAL INCISION OF THE PROSTATE (TUIP);  Surgeon: [name];  Location: [facility];  Service: Urology;  Laterality: N/A;  .  Esophageal biopsy  03/02/2011    Procedure: BIOPSY;  Surgeon: [name];  Location: [facility];  Service: Urology;;  . Video bronchoscopy Bilateral 05/31/2012    Procedure: VIDEO BRONCHOSCOPY WITHOUT FLUORO;  Surgeon: Barbaraann Share, MD;  Location: [facility];  Service: Cardiopulmonary;  Laterality: Bilateral;    No family history on file.  History   Social History  . Marital Status: Married    Spouse Name: N/A    Number of Children: N/A  . Years of Education: N/A   Occupational History  . Not on file.   Social History Main Topics  . Smoking status: Former Smoker -- 1.00 packs/day for 30 years    Types: Cigarettes    Quit date: 02/23/1984  . Smokeless tobacco: Not on  file  . Alcohol Use: No  . Drug Use: No  . Sexually Active:    Other Topics Concern  . Not on file   Social History Narrative  . No narrative on file    History  Smoking status  . Former Smoker -- 1.00 packs/day for 30 years  . Types: Cigarettes  . Quit date: 02/23/1984  Smokeless tobacco  . Not on file    History  Alcohol Use No     Allergies  Allergen Reactions  . Penicillins Other (See Comments)    Pt doesn't remember the reaction  . Warfarin And Related Other (See Comments)    Pt gets the shakes    Current Outpatient Prescriptions  Medication Sig Dispense Refill  . amiodarone (PACERONE) 200 MG tablet Take 100 mg by mouth daily. Pt takes (0.5 tab) for 100mg  dosage      . atorvastatin (LIPITOR) 10 MG tablet Take 10 mg by mouth every morning.      . calcium-vitamin D (OSCAL WITH D) 500-200 MG-UNIT per tablet Take 1 tablet by mouth daily.      . clonazePAM (KLONOPIN) 0.5 MG tablet Take 0.5 mg by mouth 2 (two) times daily as needed. For anxiety      . dabigatran (PRADAXA) 150 MG CAPS Take 150 mg by mouth every 12 (twelve) hours.       Marland Kitchen HYDROcodone-acetaminophen (NORCO/VICODIN) 5-325 MG per tablet Take 1 tablet by mouth every 6 (six) hours as needed for pain.  20 tablet  0  . levothyroxine (SYNTHROID, LEVOTHROID) 50 MCG tablet Take 50 mcg by mouth every morning.       . metoprolol (TOPROL-XL) 50 MG 24 hr tablet Take 25 mg by mouth every morning.       . mirabegron ER (MYRBETRIQ) 50 MG TB24 Take 50 mg by mouth daily.      . Misc Natural Products (OSTEO BI-FLEX ADV DOUBLE ST PO) Take 1 tablet by mouth 2 (two) times daily.       . phenytoin (DILANTIN) 100 MG ER capsule Take 200 mg by mouth 2 (two) times daily.       Bertram Gala Glycol-Propyl Glycol (SYSTANE OP) Apply 1 drop to eye as needed. For dry eyes      . polyethylene glycol (MIRALAX / GLYCOLAX) packet Take 17 g by mouth daily.      Marland Kitchen rOPINIRole (REQUIP) 0.5 MG tablet Take 0.5 mg by mouth 3 (three) times daily.         . sertraline (ZOLOFT) 50 MG tablet Take 100 mg by mouth every evening.       . vitamin C (ASCORBIC ACID) 500 MG tablet Take 500 mg by mouth daily.  No current facility-administered medications for this visit.      Physical Exam: BP 107/60  Pulse 76  Resp 16  Ht 5\' 6"  (1.676 m)  Wt 160 lb (72.576 kg)  BMI 25.84 kg/m2  SpO2 96%  General appearance: alert, cooperative, appears stated age and no distress Neurologic: intact Heart: regular rate and rhythm, S1, S2 normal, no murmur, click, rub or gallop and normal apical impulse Lungs: clear to auscultation bilaterally Abdomen: soft, non-tender; bowel sounds normal; no masses,  no organomegaly Extremities: extremities normal, atraumatic, no cyanosis or edema and Homans sign is negative, no sign of DVT Patient has no cervical supraclavicular or axillary adenopathy, he has no pedal edema, pedal pulses are intact bilaterally   Diagnostic Studies & Laboratory data:     Recent Radiology Findings:   Dg Chest 2 View  10/05/2012   *RADIOLOGY REPORT*  Clinical Data: History of pleural effusion right lung.  CHEST - 2 VIEW  Comparison: 09/13/2012  Findings: Heart is normal size.  Mediastinal contours are within normal limits.  Prior median sternotomy and valve replacement. There is a right Pleurx catheter in place, stable.  Small right pleural effusion, similar to prior study.  No effusion on the left. Minimal right base atelectasis.  No pneumothorax.  IMPRESSION: Stable small right pleural effusion with right Pleurx catheter in stable position.   Original Report Authenticated By: Charlett Nose, M.D.      Recent Lab Findings: Lab Results  Component Value Date   WBC 9.1 05/18/2012   HGB 14.8 05/18/2012   HCT 42.9 05/18/2012   PLT 210 05/18/2012   GLUCOSE 83 05/18/2012   ALT 12 06/07/2008   AST 28 06/07/2008   NA 138 05/18/2012   K 4.4 05/18/2012   CL 105 05/18/2012   CREATININE 1.29 05/18/2012   BUN 25* 05/18/2012   CO2 24 05/18/2012   TSH  7.497* 06/17/2010   INR 1.27 05/18/2012      Assessment / Plan:    Patient is an 79 year old active male now with persistent drainage from right malignant pleural effusion. I have reviewed with the patient the options of continuing with current therapy, proceeding with a VATS and talc pleurodesis, or placing talc slurry in the current Pleurx catheter and right pleural space. We have increased his pleural drainage to once every 5 days, is now draining about  600 over that period I've offered to the patient proceeding with a right that's and talc produces, but he is not interested in any invasive procedures. We will continue drainage of his Pleurx q. 5 days, if the amount drops further we'll consider talc slurry in the Pleurx. I plan to see him back in 6 weeks, he plans to spend the month of July in [place].    Delight Ovens MD  Beeper [phone] Office [phone] 10/05/2012 2:07 PM

## 2012-10-06 ENCOUNTER — Other Ambulatory Visit: Payer: Self-pay | Admitting: Neurology

## 2012-11-22 ENCOUNTER — Other Ambulatory Visit: Payer: Self-pay

## 2012-11-23 ENCOUNTER — Ambulatory Visit
Admission: RE | Admit: 2012-11-23 | Discharge: 2012-11-23 | Disposition: A | Discharge status: Home / Self Care | Payer: Medicare Other | Source: Ambulatory Visit | Attending: Cardiothoracic Surgery | Admitting: Cardiothoracic Surgery

## 2012-11-23 ENCOUNTER — Other Ambulatory Visit: Payer: Self-pay | Admitting: *Deleted

## 2012-11-23 DIAGNOSIS — J90 Pleural effusion, not elsewhere classified: Secondary | ICD-10-CM

## 2012-11-27 ENCOUNTER — Ambulatory Visit (HOSPITAL_BASED_OUTPATIENT_CLINIC_OR_DEPARTMENT_OTHER): Payer: Medicare Other | Admitting: Oncology

## 2012-11-27 ENCOUNTER — Telehealth: Payer: Self-pay | Admitting: Oncology

## 2012-11-27 VITALS — BP 95/51 | HR 71 | Temp 97.4°F | Resp 18 | Ht 66.0 in | Wt 174.8 lb

## 2012-11-27 DIAGNOSIS — G8929 Other chronic pain: Secondary | ICD-10-CM

## 2012-11-27 DIAGNOSIS — C801 Malignant (primary) neoplasm, unspecified: Secondary | ICD-10-CM

## 2012-11-27 DIAGNOSIS — J90 Pleural effusion, not elsewhere classified: Secondary | ICD-10-CM

## 2012-11-27 DIAGNOSIS — F329 Major depressive disorder, single episode, unspecified: Secondary | ICD-10-CM

## 2012-11-27 NOTE — Telephone Encounter (Signed)
gave pt appt for MD only on October 2014

## 2012-11-27 NOTE — Progress Notes (Signed)
   [facility]    OFFICE PROGRESS NOTE   INTERVAL HISTORY:   He returns as scheduled. He went on the trip to [place], but had to return early secondary to his wife becoming ill. She continues to have "hip "pain following surgery and he reports she is now been diagnosed with spinal stenosis. She is scheduled for surgery next week. [name] complains of anxiety related to his wife's medical issues and dementia. He has a good appetite. No dyspnea or pain. He continues to drain the Pleurx every 5 days for up to 1000 cc of blood-tinged fluid.  Objective:  Vital signs in last 24 hours:  Blood pressure 95/51, pulse 71, temperature 97.4 F (36.3 C), temperature source Oral, resp. rate 18, height 5\' 6"  (1.676 m), weight 174 lb 12.8 oz (79.289 kg).    HEENT: Neck without mass Lymphatics: No cervical, supra-clavicular, or axillary nodes Resp: Decreased breath sounds at the right base, no respiratory distress Cardio: Regular rate and rhythm GI: No hepatomegaly Vascular: No leg edema  Skin: Pleurx catheter site at the right chest with a gauze dressing   X-ray: 11/23/2012-blunting of the right costophrenic angle, no acute infiltrate or pneumothorax  Medications: I have reviewed the patient's current medications.  Assessment/Plan: 1. 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 Staging PET scan 05/19/2012 showed an area of hypermetabolic atelectatic lung in the right middle lobe and no other evidence of hypermetabolic activity.An unknown primary gene array assay returned most consistentwith a diagnosis of mesothelioma and additional immunohistochemical stains on the pleural cytology confirmed the malignant-appearing cells are of mesothelial origin. 2. Exertional dyspnea secondary to the large right pleural effusion status post placement of a Pleurx catheter 05/19/2012. The  dyspnea is improved. The Pleurx catheter continues to have a significant output. 3. Status post bronchoscopy 05/31/2012 with no lesion in the right middle lobe. Right middle lobe bronchial brushings and bronchial lavage negative for malignant cells. 4. Status post aortic valve replacement. 5. History of atrial fibrillation. 6. Chronic back pain. 7. Osteoarthritis. 8. Chronic renal insufficiency. 9. BPH. 10. Intermittent "flushing" episodes over the past year. Question etiology. 11. Depression-taking Zoloft  Disposition:  He appears stable. The Pleurx catheter continues to have a large output. He is scheduled to see Dr. Tyrone Sage later this week. Dr. Tyrone Sage will decide on a talc pleurodesis or VATS procedure. He will return for an office visit here in 2 months.   William Papas, MD  11/27/2012  5:33 PM

## 2012-11-28 ENCOUNTER — Other Ambulatory Visit: Payer: Self-pay | Admitting: *Deleted

## 2012-11-28 DIAGNOSIS — J90 Pleural effusion, not elsewhere classified: Secondary | ICD-10-CM

## 2012-11-30 ENCOUNTER — Encounter: Payer: Self-pay | Admitting: Cardiothoracic Surgery

## 2012-11-30 ENCOUNTER — Ambulatory Visit (INDEPENDENT_AMBULATORY_CARE_PROVIDER_SITE_OTHER): Payer: Medicare Other | Admitting: Cardiothoracic Surgery

## 2012-11-30 VITALS — BP 103/58 | HR 70 | Resp 16 | Ht 66.0 in | Wt 170.0 lb

## 2012-11-30 DIAGNOSIS — Z09 Encounter for follow-up examination after completed treatment for conditions other than malignant neoplasm: Secondary | ICD-10-CM

## 2012-11-30 DIAGNOSIS — J910 Malignant pleural effusion: Secondary | ICD-10-CM

## 2012-11-30 NOTE — Progress Notes (Signed)
[address]       [place] [address]             [phone]                  [name] [facility] Medical Record #[id number] Date of Birth: 1927/06/24  Referring: Dr Myrle Sheng Primary Care: Darnelle Bos, MD  Chief Complaint:   Malignant pleural effusion with Pleurx catheter in place right chest  History of Present Illness:      The patient is an 79 year old male  was in good state of health dec 2013  when he began to notice shortness of breath while swimming. He also noted cough as well as pleuritic chest pain, as well as a weight loss. A chest x-ray was done which showed a large right pleural effusion, and ultimately a thoracentesis revealed an exudative effusion with a low glucose. The pathology returned positive for adenocarcinoma. Immunohistochemical markers suggested mesothelioma. Because of a recurring effusion. The patient has had a Pleurx catheter placed by radiology last January. I saw in early June. We've made some timing changes on frequency of drainage, and the patient left and went to [place] for the remainder of the summer.   There are also pleural calcifications, and the patient tells me he has worked in [employer] with exposure to asbestos. However, he did not handle the material directly.   Patient does complain of some discomfort around the right lower chest especially early after draining the fluid. He is hesitant about taking pain medicine because of the fear of constipation.  Currently he is draining the catheter once every 5 days and has been draining approximate 600- 1000 ml over that time interval.  Chest x-ray done today shows clear lung fields bilaterally without persistent significant effusion.  The patient today is very emotional, concerned about back surgery coming up in the next week or 2 on his wife. I have offered to make arrangements for counseling services or the nurse navigator to assist him. He has  refused   Current Activity/ Functional Status:  Patient is independent with mobility/ambulation, transfers, ADL's, IADL's.  Zubrod Score: At the time of surgery this patient's most appropriate activity status/level should be described as: []  Normal activity, no symptoms [x]  Symptoms, fully ambulatory []  Symptoms, in bed less than or equal to 50% of the time []  Symptoms, in bed greater than 50% of the time but less than 100% []  Bedridden []  Moribund   Past Medical History  Diagnosis Date  . Hypothyroidism     takes Levothyroxine  . Coronary artery disease     valve replaced  . Sleep apnea 2011    does not use CPAP  . Seizures     cannot remember last seizure  . Arthritis     all over body  . Anxiety     comes and goes  . Depression     comes and goes  . Dysrhythmia     recurrent arrhythmia w/AV replacement  . CHF (congestive heart failure) 02/10/11 note    has compensated CHF per Dr. Katrinka Blazing note  . Constipation 08/2009    after surgery  . Urinary tract bacterial infections 08/2010    from surgery and catheters    Past Surgical History  Procedure Laterality Date  . Cardiac valve replacement  2001  . Back surgery  2011-last one    6 surgeries  . Cardiac catheterization  2001    before  valve replacement  . Joint replacement  08/2010    right knee  . Joint replacement  2004    left knee  . Transurethral incision of prostate  03/02/2011    Procedure: TRANSURETHRAL INCISION OF THE PROSTATE (TUIP);  Surgeon: [name];  Location: [facility];  Service: Urology;  Laterality: N/A;  . Esophageal biopsy  03/02/2011    Procedure: BIOPSY;  Surgeon: [name];  Location: [facility];  Service: Urology;;  . Video bronchoscopy Bilateral 05/31/2012    Procedure: VIDEO BRONCHOSCOPY WITHOUT FLUORO;  Surgeon: Barbaraann Share, MD;  Location: [facility];  Service: Cardiopulmonary;  Laterality: Bilateral;    No family history on file.  History   Social History  . Marital Status:  Married    Spouse Name: N/A    Number of Children: N/A  . Years of Education: N/A   Occupational History  . Not on file.   Social History Main Topics  . Smoking status: Former Smoker -- 1.00 packs/day for 30 years    Types: Cigarettes    Quit date: 02/23/1984  . Smokeless tobacco: Not on file  . Alcohol Use: No  . Drug Use: No  . Sexual Activity:    Other Topics Concern  . Not on file   Social History Narrative  . No narrative on file    History  Smoking status  . Former Smoker -- 1.00 packs/day for 30 years  . Types: Cigarettes  . Quit date: 02/23/1984  Smokeless tobacco  . Not on file    History  Alcohol Use No     Allergies  Allergen Reactions  . Penicillins Other (See Comments)    Pt doesn't remember the reaction  . Warfarin And Related Other (See Comments)    Pt gets the shakes    Current Outpatient Prescriptions  Medication Sig Dispense Refill  . amiodarone (PACERONE) 200 MG tablet Take 100 mg by mouth daily. Pt takes (0.5 tab) for 100mg  dosage      . atorvastatin (LIPITOR) 10 MG tablet Take 10 mg by mouth every morning.      . calcium-vitamin D (OSCAL WITH D) 500-200 MG-UNIT per tablet Take 1 tablet by mouth daily.      . clonazePAM (KLONOPIN) 0.5 MG tablet Take 0.5 mg by mouth 2 (two) times daily as needed. For anxiety      . dabigatran (PRADAXA) 150 MG CAPS Take 150 mg by mouth every 12 (twelve) hours.       Marland Kitchen HYDROcodone-acetaminophen (NORCO/VICODIN) 5-325 MG per tablet Take 1 tablet by mouth every 6 (six) hours as needed for pain.  50 tablet  0  . levothyroxine (SYNTHROID, LEVOTHROID) 50 MCG tablet Take 50 mcg by mouth every morning.       . metoprolol (TOPROL-XL) 50 MG 24 hr tablet Take 25 mg by mouth every morning.       . mirabegron ER (MYRBETRIQ) 50 MG TB24 Take 50 mg by mouth daily.      . Misc Natural Products (OSTEO BI-FLEX ADV DOUBLE ST PO) Take 1 tablet by mouth 2 (two) times daily.       . phenytoin (DILANTIN) 100 MG ER capsule TAKE 2  CAPSULES TWICE A DAY  360 capsule  1  . Polyethyl Glycol-Propyl Glycol (SYSTANE OP) Apply 1 drop to eye as needed. For dry eyes      . polyethylene glycol (MIRALAX / GLYCOLAX) packet Take 17 g by mouth daily.      Marland Kitchen rOPINIRole (  REQUIP) 0.5 MG tablet Take 0.5 mg by mouth 3 (three) times daily.       . sertraline (ZOLOFT) 50 MG tablet Take 100 mg by mouth every evening.       . vitamin C (ASCORBIC ACID) 500 MG tablet Take 500 mg by mouth daily.       No current facility-administered medications for this visit.      Physical Exam: BP 103/58  Pulse 70  Resp 16  Ht 5\' 6"  (1.676 m)  Wt 170 lb (77.111 kg)  BMI 27.45 kg/m2  SpO2 96%  General appearance: alert, cooperative, appears stated age and no distress Neurologic: intact Heart: regular rate and rhythm, S1, S2 normal, no murmur, click, rub or gallop and normal apical impulse Lungs: clear to auscultation bilaterally Abdomen: soft, non-tender; bowel sounds normal; no masses,  no organomegaly Extremities: extremities normal, atraumatic, no cyanosis or edema and Homans sign is negative, no sign of DVT Patient has no cervical supraclavicular or axillary adenopathy, he has no pedal edema, pedal pulses are intact bilaterally   Diagnostic Studies & Laboratory data:     Recent Radiology Findings:    Dg Chest 2 View  11/23/2012   *RADIOLOGY REPORT*  Clinical Data: Follow up pleural effusion, history mesothelioma, coronary artery disease, CHF, smoking  CHEST - 2 VIEW  Comparison: 10/05/2012  Findings: Right basilar thoracostomy tube. Enlargement of cardiac silhouette post median sternotomy and AVR. Mediastinal contours and pulmonary vascularity normal. Scattered pleural plaques, partially calcified by prior CT, consistent with asbestos exposure. Blunting of the right costophrenic angle by chronic effusion or pleural thickening. No definite acute infiltrate, pneumothorax, or acute osseous findings. Prior lumbar fusion.  IMPRESSION: Chronic pleural  changes as above. Enlargement of cardiac silhouette post AVR.   Original Report Authenticated By: Ulyses Southward, M.D.     Recent Lab Findings: Lab Results  Component Value Date   WBC 9.1 05/18/2012   HGB 14.8 05/18/2012   HCT 42.9 05/18/2012   PLT 210 05/18/2012   GLUCOSE 83 05/18/2012   ALT 12 06/07/2008   AST 28 06/07/2008   NA 138 05/18/2012   K 4.4 05/18/2012   CL 105 05/18/2012   CREATININE 1.29 05/18/2012   BUN 25* 05/18/2012   CO2 24 05/18/2012   TSH 7.497* 06/17/2010   INR 1.27 05/18/2012      Assessment / Plan:    Patient is an 79 year old active male now with persistent drainage from right malignant pleural effusion. I have again reviewed with the patient the options of continuing with current therapy, proceeding with a VATS and talc pleurodesis, or placing talc slurry in the current Pleurx catheter and right pleural space.  He has refused any specific treatment for the mesothelioma, and is reluctant to have any invasive procedure done.  I have again offered to place talc slurry in the Pleurx catheter to attempt to decrease drainage. He is clear this does not specifically treat any cancer. He prefers to wait because of the upcoming surgery on his wife. I'll plan to see him back in 3 weeks     Delight Ovens MD  Beeper [phone] Office [phone] 11/30/2012 11:19 AM

## 2012-12-20 ENCOUNTER — Other Ambulatory Visit: Payer: Self-pay | Admitting: *Deleted

## 2012-12-21 ENCOUNTER — Encounter: Payer: Self-pay | Admitting: Cardiothoracic Surgery

## 2012-12-21 ENCOUNTER — Other Ambulatory Visit: Payer: Self-pay | Admitting: *Deleted

## 2012-12-21 ENCOUNTER — Ambulatory Visit (INDEPENDENT_AMBULATORY_CARE_PROVIDER_SITE_OTHER): Payer: Medicare Other | Admitting: Cardiothoracic Surgery

## 2012-12-21 VITALS — BP 104/56 | HR 76 | Resp 20 | Ht 66.0 in | Wt 170.0 lb

## 2012-12-21 DIAGNOSIS — J910 Malignant pleural effusion: Secondary | ICD-10-CM

## 2012-12-21 DIAGNOSIS — Z09 Encounter for follow-up examination after completed treatment for conditions other than malignant neoplasm: Secondary | ICD-10-CM

## 2012-12-21 MED ORDER — TALC 5 G PL SUSR: 3.0000 g | Freq: Once | INTRAVENOUS | Status: DC

## 2012-12-21 NOTE — Patient Instructions (Signed)
Increase pleurex  drainage  To every three days Come as outpatient for talc via tube

## 2012-12-21 NOTE — Progress Notes (Signed)
[address]       [place] [address]             [phone]                [name] [facility] Medical Record #[id number] Date of Birth: [birth year]  Referring: Dr Myrle Sheng Primary Care: Darnelle Bos, MD  Chief Complaint:   Malignant pleural effusion with Pleurx catheter in place right chest  History of Present Illness:      The patient is an 79 year old male  was in good state of health dec 2013  when he began to notice shortness of breath while swimming. He also noted cough as well as pleuritic chest pain, as well as a weight loss. A chest x-ray was done which showed a large right pleural effusion, and ultimately a thoracentesis revealed an exudative effusion with a low glucose. The pathology returned positive for adenocarcinoma. Immunohistochemical markers suggested mesothelioma. Because of a recurring effusion. The patient has had a Pleurx catheter placed by radiology last January. I saw in early June.   Currently he is draining the catheter once every 5 days and has been draining approximate 600- 1000 ml over that time interval. Since last seen he continues to have drainage between 800 - 1000 ml He has been started on antidepressant by Dr. Earl Gala  Current Activity/ Functional Status:  Patient is independent with mobility/ambulation, transfers, ADL's, IADL's.  Zubrod Score: At the time of surgery this patient's most appropriate activity status/level should be described as: []  Normal activity, no symptoms [x]  Symptoms, fully ambulatory []  Symptoms, in bed less than or equal to 50% of the time []  Symptoms, in bed greater than 50% of the time but less than 100% []  Bedridden []  Moribund   Past Medical History  Diagnosis Date  . Hypothyroidism     takes Levothyroxine  . Coronary artery disease     valve replaced  . Sleep apnea 2011    does not use CPAP  . Seizures     cannot remember last seizure  . Arthritis     all over body    . Anxiety     comes and goes  . Depression     comes and goes  . Dysrhythmia     recurrent arrhythmia w/AV replacement  . CHF (congestive heart failure) 02/10/11 note    has compensated CHF per Dr. Katrinka Blazing note  . Constipation 08/2009    after surgery  . Urinary tract bacterial infections 08/2010    from surgery and catheters    Past Surgical History  Procedure Laterality Date  . Cardiac valve replacement  2001  . Back surgery  2011-last one    6 surgeries  . Cardiac catheterization  2001    before valve replacement  . Joint replacement  08/2010    right knee  . Joint replacement  2004    left knee  . Transurethral incision of prostate  03/02/2011    Procedure: TRANSURETHRAL INCISION OF THE PROSTATE (TUIP);  Surgeon: [name];  Location: [facility];  Service: Urology;  Laterality: N/A;  . Esophageal biopsy  03/02/2011    Procedure: BIOPSY;  Surgeon: [name];  Location: [facility];  Service: Urology;;  . Video bronchoscopy Bilateral 05/31/2012    Procedure: VIDEO BRONCHOSCOPY WITHOUT FLUORO;  Surgeon: Barbaraann Share, MD;  Location: [facility];  Service: Cardiopulmonary;  Laterality: Bilateral;    No family history on file.  History   Social History  . Marital Status: Married    Spouse Name: N/A    Number of Children: N/A  . Years of Education: N/A   Occupational History  . Not on file.   Social History Main Topics  . Smoking status: Former Smoker -- 1.00 packs/day for 30 years    Types: Cigarettes    Quit date: 02/23/1984  . Smokeless tobacco: Not on file  . Alcohol Use: No  . Drug Use: No  . Sexual Activity:    Other Topics Concern  . Not on file   Social History Narrative  . No narrative on file    History  Smoking status  . Former Smoker -- 1.00 packs/day for 30 years  . Types: Cigarettes  . Quit date: 02/23/1984  Smokeless tobacco  . Not on file    History  Alcohol Use No     Allergies  Allergen Reactions  . Penicillins Other (See  Comments)    Pt doesn't remember the reaction  . Warfarin And Related Other (See Comments)    Pt gets the shakes    Current Outpatient Prescriptions  Medication Sig Dispense Refill  . amiodarone (PACERONE) 200 MG tablet Take 100 mg by mouth daily. Pt takes (0.5 tab) for 100mg  dosage      . atorvastatin (LIPITOR) 10 MG tablet Take 10 mg by mouth every morning.      . calcium-vitamin D (OSCAL WITH D) 500-200 MG-UNIT per tablet Take 1 tablet by mouth daily.      . clonazePAM (KLONOPIN) 0.5 MG tablet Take 0.5 mg by mouth 2 (two) times daily as needed. For anxiety      . dabigatran (PRADAXA) 150 MG CAPS Take 150 mg by mouth every 12 (twelve) hours.       Marland Kitchen HYDROcodone-acetaminophen (NORCO/VICODIN) 5-325 MG per tablet Take 1 tablet by mouth every 6 (six) hours as needed for pain.  50 tablet  0  . levothyroxine (SYNTHROID, LEVOTHROID) 50 MCG tablet Take 50 mcg by mouth every morning.       . metoprolol (TOPROL-XL) 50 MG 24 hr tablet Take 25 mg by mouth every morning.       . mirabegron ER (MYRBETRIQ) 50 MG TB24 Take 50 mg by mouth daily.      . Misc Natural Products (OSTEO BI-FLEX ADV DOUBLE ST PO) Take 1 tablet by mouth 2 (two) times daily.       . phenytoin (DILANTIN) 100 MG ER capsule TAKE 2 CAPSULES TWICE A DAY  360 capsule  1  . Polyethyl Glycol-Propyl Glycol (SYSTANE OP) Apply 1 drop to eye as needed. For dry eyes      . polyethylene glycol (MIRALAX / GLYCOLAX) packet Take 17 g by mouth daily.      Marland Kitchen rOPINIRole (REQUIP) 0.5 MG tablet Take 0.5 mg by mouth 3 (three) times daily.       . sertraline (ZOLOFT) 50 MG tablet Take 100 mg by mouth every evening.       . vitamin C (ASCORBIC ACID) 500 MG tablet Take 500 mg by mouth daily.       No current facility-administered medications for this visit.      Physical Exam: BP 104/56  Pulse 76  Resp 20  Ht 5\' 6"  (1.676 m)  Wt 170 lb (77.111 kg)  BMI 27.45 kg/m2  SpO2 96%  General appearance: alert, cooperative, appears stated age and no  distress Neurologic: intact Heart: regular rate and rhythm, S1, S2  normal, no murmur, click, rub or gallop and normal apical impulse Lungs: clear to auscultation bilaterally Abdomen: soft, non-tender; bowel sounds normal; no masses,  no organomegaly Extremities: extremities normal, atraumatic, no cyanosis or edema and Homans sign is negative, no sign of DVT Patient has no cervical supraclavicular or axillary adenopathy, he has no pedal edema, pedal pulses are intact bilaterally   Diagnostic Studies & Laboratory data:     Recent Radiology Findings:  Dg Chest 2 View  11/23/2012   *RADIOLOGY REPORT*  Clinical Data: Follow up pleural effusion, history mesothelioma, coronary artery disease, CHF, smoking  CHEST - 2 VIEW  Comparison: 10/05/2012  Findings: Right basilar thoracostomy tube. Enlargement of cardiac silhouette post median sternotomy and AVR. Mediastinal contours and pulmonary vascularity normal. Scattered pleural plaques, partially calcified by prior CT, consistent with asbestos exposure. Blunting of the right costophrenic angle by chronic effusion or pleural thickening. No definite acute infiltrate, pneumothorax, or acute osseous findings. Prior lumbar fusion.  IMPRESSION: Chronic pleural changes as above. Enlargement of cardiac silhouette post AVR.   Original Report Authenticated By: Ulyses Southward, M.D.   Recent Lab Findings: Lab Results  Component Value Date   WBC 9.1 05/18/2012   HGB 14.8 05/18/2012   HCT 42.9 05/18/2012   PLT 210 05/18/2012   GLUCOSE 83 05/18/2012   ALT 12 06/07/2008   AST 28 06/07/2008   NA 138 05/18/2012   K 4.4 05/18/2012   CL 105 05/18/2012   CREATININE 1.29 05/18/2012   BUN 25* 05/18/2012   CO2 24 05/18/2012   TSH 7.497* 06/17/2010   INR 1.27 05/18/2012      Assessment / Plan:    Patient is an 79 year old active male now with persistent drainage from right malignant pleural effusion. I have again reviewed with the patient the options of continuing with current  therapy, proceeding with a VATS and talc pleurodesis, or placing talc slurry in the current Pleurx catheter and right pleural space.  He has refused any specific treatment for the mesothelioma, and is reluctant to have any invasive procedure done.  I have again offered to place talc slurry in the Pleurx catheter to attempt to decrease drainage.  The patient now is agreeable to talc pleurodesis ,  We will increase the drainage to  every 3 days,  On Monday we will see him in the outpatient area and proceed with talc pleurodesis .   Delight Ovens  MD  Beeper [phone] Office [phone] 12/21/2012 10:27 AM

## 2012-12-22 ENCOUNTER — Encounter (HOSPITAL_COMMUNITY): Payer: Self-pay | Admitting: Pharmacy Technician

## 2012-12-25 ENCOUNTER — Ambulatory Visit (HOSPITAL_COMMUNITY)
Admission: RE | Admit: 2012-12-25 | Discharge: 2012-12-25 | Disposition: A | Discharge status: Home / Self Care | Payer: Medicare Other | Source: Ambulatory Visit | Attending: Cardiothoracic Surgery | Admitting: Cardiothoracic Surgery

## 2012-12-25 ENCOUNTER — Other Ambulatory Visit: Payer: Self-pay | Admitting: *Deleted

## 2012-12-25 ENCOUNTER — Encounter (HOSPITAL_COMMUNITY)
Admission: RE | Disposition: A | Discharge status: Home / Self Care | Payer: Self-pay | Source: Ambulatory Visit | Attending: Cardiothoracic Surgery

## 2012-12-25 DIAGNOSIS — Z79899 Other long term (current) drug therapy: Secondary | ICD-10-CM | POA: Insufficient documentation

## 2012-12-25 DIAGNOSIS — J910 Malignant pleural effusion: Secondary | ICD-10-CM | POA: Insufficient documentation

## 2012-12-25 DIAGNOSIS — M129 Arthropathy, unspecified: Secondary | ICD-10-CM | POA: Insufficient documentation

## 2012-12-25 DIAGNOSIS — Z954 Presence of other heart-valve replacement: Secondary | ICD-10-CM | POA: Insufficient documentation

## 2012-12-25 DIAGNOSIS — C3490 Malignant neoplasm of unspecified part of unspecified bronchus or lung: Secondary | ICD-10-CM | POA: Insufficient documentation

## 2012-12-25 DIAGNOSIS — Z880 Allergy status to penicillin: Secondary | ICD-10-CM | POA: Insufficient documentation

## 2012-12-25 DIAGNOSIS — Z87891 Personal history of nicotine dependence: Secondary | ICD-10-CM | POA: Insufficient documentation

## 2012-12-25 DIAGNOSIS — Z7901 Long term (current) use of anticoagulants: Secondary | ICD-10-CM | POA: Insufficient documentation

## 2012-12-25 DIAGNOSIS — I2510 Atherosclerotic heart disease of native coronary artery without angina pectoris: Secondary | ICD-10-CM | POA: Insufficient documentation

## 2012-12-25 DIAGNOSIS — I509 Heart failure, unspecified: Secondary | ICD-10-CM | POA: Insufficient documentation

## 2012-12-25 DIAGNOSIS — I498 Other specified cardiac arrhythmias: Secondary | ICD-10-CM | POA: Insufficient documentation

## 2012-12-25 DIAGNOSIS — J90 Pleural effusion, not elsewhere classified: Secondary | ICD-10-CM

## 2012-12-25 DIAGNOSIS — G4730 Sleep apnea, unspecified: Secondary | ICD-10-CM | POA: Insufficient documentation

## 2012-12-25 DIAGNOSIS — F411 Generalized anxiety disorder: Secondary | ICD-10-CM | POA: Insufficient documentation

## 2012-12-25 DIAGNOSIS — F329 Major depressive disorder, single episode, unspecified: Secondary | ICD-10-CM | POA: Insufficient documentation

## 2012-12-25 DIAGNOSIS — E039 Hypothyroidism, unspecified: Secondary | ICD-10-CM | POA: Insufficient documentation

## 2012-12-25 DIAGNOSIS — Z888 Allergy status to other drugs, medicaments and biological substances status: Secondary | ICD-10-CM | POA: Insufficient documentation

## 2012-12-25 DIAGNOSIS — F3289 Other specified depressive episodes: Secondary | ICD-10-CM | POA: Insufficient documentation

## 2012-12-25 HISTORY — PX: TALC PLEURODESIS: SHX2506

## 2012-12-25 SURGERY — PLEURODESIS, USING TALC
Anesthesia: LOCAL | Laterality: Right | Wound class: Clean Contaminated

## 2012-12-25 MED ORDER — TALC 5 G PL SUSR
3.0000 g | Freq: Once | INTRAVENOUS | Status: DC
  Filled 2012-12-25: qty 3

## 2012-12-25 NOTE — Procedures (Signed)
DRAINAGE OF PLEUR-X CATHETER/TALC PLEURADESIS  Procedure Note  [name] 12/25/2012   Pre-op Diagnosis: MALIGNANT PLEURAL EFFUSION     Post-op Diagnosis: MALIGNANT PLEURAL EFFUSION  Procedure(s): DRAINAGE OF PLEUR-X CATHETER (RIGHT) TALC PLEURADESIS (RIGHT) - 3G with 1% Lidocaine  Surgeon(s): Delight Ovens, MD  Anesthesia: None  Staff:  Dr. Tyrone Sage  Estimated Blood Loss: Minimal               Specimens: 350 cc Blood Tinged Serous Pleural Fluid  Complications: None, patient tolerated well          [name]   Date: 12/25/2012  Time: 2:07 PM

## 2012-12-26 ENCOUNTER — Other Ambulatory Visit: Payer: Self-pay | Admitting: *Deleted

## 2012-12-26 ENCOUNTER — Encounter (HOSPITAL_COMMUNITY): Payer: Self-pay | Admitting: Cardiothoracic Surgery

## 2012-12-26 DIAGNOSIS — G8918 Other acute postprocedural pain: Secondary | ICD-10-CM

## 2012-12-26 MED ORDER — OXYCODONE-ACETAMINOPHEN 10-325 MG PO TABS: 1.0000 | ORAL_TABLET | ORAL | Status: DC | PRN

## 2012-12-26 NOTE — Progress Notes (Signed)
Pt states he received no pain medication prescription from the procedure yesterday.  Patient states pain is unbearable.  Pt strongly encouraged to call physician right away to inform him of the pain, and to be evaluated.

## 2013-01-01 ENCOUNTER — Encounter: Payer: Self-pay | Admitting: Oncology

## 2013-01-01 ENCOUNTER — Encounter: Payer: Self-pay | Admitting: Cardiothoracic Surgery

## 2013-01-02 ENCOUNTER — Telehealth: Payer: Self-pay | Admitting: *Deleted

## 2013-01-02 NOTE — Telephone Encounter (Signed)
Spoke with patient regarding need to continue to order his aspira supplies. He still has his drain, but has had not output since the talc pleurodesis procedure. Sees Dr. Tyrone Sage on 9/18 to see if he will be able to have the drain removed. He will call office next week to let us know if we need to reorder supplies from [employer] [facility] He just received a shipment of #10 kits and drains the cath every 3 days. Will hold form at collaborative nurse desk for now.

## 2013-01-03 ENCOUNTER — Ambulatory Visit
Admission: RE | Admit: 2013-01-03 | Discharge: 2013-01-03 | Disposition: A | Discharge status: Home / Self Care | Payer: Medicare Other | Source: Ambulatory Visit | Attending: Cardiothoracic Surgery | Admitting: Cardiothoracic Surgery

## 2013-01-03 DIAGNOSIS — J90 Pleural effusion, not elsewhere classified: Secondary | ICD-10-CM

## 2013-01-04 ENCOUNTER — Other Ambulatory Visit: Payer: Self-pay | Admitting: *Deleted

## 2013-01-04 ENCOUNTER — Encounter: Payer: Self-pay | Admitting: Cardiothoracic Surgery

## 2013-01-04 ENCOUNTER — Ambulatory Visit
Admission: RE | Admit: 2013-01-04 | Discharge: 2013-01-04 | Disposition: A | Discharge status: Home / Self Care | Payer: Medicare Other | Source: Ambulatory Visit | Attending: Cardiothoracic Surgery | Admitting: Cardiothoracic Surgery

## 2013-01-04 ENCOUNTER — Ambulatory Visit (INDEPENDENT_AMBULATORY_CARE_PROVIDER_SITE_OTHER): Payer: Medicare Other | Admitting: Cardiothoracic Surgery

## 2013-01-04 VITALS — BP 112/64 | HR 107 | Resp 18 | Ht 66.0 in | Wt 160.0 lb

## 2013-01-04 DIAGNOSIS — J910 Malignant pleural effusion: Secondary | ICD-10-CM

## 2013-01-04 DIAGNOSIS — Z09 Encounter for follow-up examination after completed treatment for conditions other than malignant neoplasm: Secondary | ICD-10-CM

## 2013-01-05 ENCOUNTER — Telehealth: Payer: Self-pay | Admitting: Oncology

## 2013-01-05 ENCOUNTER — Ambulatory Visit (INDEPENDENT_AMBULATORY_CARE_PROVIDER_SITE_OTHER): Payer: Medicare Other | Admitting: Cardiothoracic Surgery

## 2013-01-05 ENCOUNTER — Telehealth: Payer: Self-pay | Admitting: *Deleted

## 2013-01-05 ENCOUNTER — Encounter (HOSPITAL_COMMUNITY): Payer: Self-pay | Admitting: Pharmacist

## 2013-01-05 ENCOUNTER — Other Ambulatory Visit: Payer: Self-pay | Admitting: *Deleted

## 2013-01-05 DIAGNOSIS — J90 Pleural effusion, not elsewhere classified: Secondary | ICD-10-CM

## 2013-01-05 NOTE — Telephone Encounter (Signed)
Reviewed earlier note with Dr. Truett Perna: pt will be worked in for office visit next week. Orders sent to schedulers for appt.

## 2013-01-05 NOTE — Telephone Encounter (Signed)
Called pt left message regarding appt for MD visit on 9/25

## 2013-01-05 NOTE — Progress Notes (Signed)
[address]       [place] [address]             [phone]                   [name] Medical Record #[id number] Date of Birth: June 12, 1927  Referring: Dr Myrle Sheng Primary Care: Darnelle Bos, MD  Chief Complaint:   Malignant pleural effusion with Pleurx catheter in place right chest  History of Present Illness:      The patient is an 79 year old male  was in good state of health dec 2013  when he began to notice shortness of breath while swimming. He also noted cough as well as pleuritic chest pain, as well as a weight loss. A chest x-ray was done which showed a large right pleural effusion, and ultimately a thoracentesis revealed an exudative effusion with a low glucose. The pathology returned positive for adenocarcinoma. Immunohistochemical markers suggested mesothelioma. Because of a recurring effusion. The patient has had a Pleurx catheter placed by radiology last January. I first saw him in early June.   Since that time he's continued to drain 500-600 mL's daily. I had recommended to him proceeding with right video-assisted thoracoscopy and talc pleurodesis. Because of this wife's medical problems he did refuse this. He was agreeable with talc produces through the Pleurx catheter. This was done last week. The patient complained of significant chest discomfort associated with the talc , but this has that has improved now. Current Activity/ Functional Status:  Patient is independent with mobility/ambulation, transfers, ADL's, IADL's.  Zubrod Score: At the time of surgery this patient's most appropriate activity status/level should be described as: []  Normal activity, no symptoms [x]  Symptoms, fully ambulatory []  Symptoms, in bed less than or equal to 50% of the time []  Symptoms, in bed greater than 50% of the time but less than 100% []  Bedridden []  Moribund   Past Medical History  Diagnosis Date  . Hypothyroidism     takes  Levothyroxine  . Coronary artery disease     valve replaced  . Sleep apnea 2011    does not use CPAP  . Seizures     cannot remember last seizure  . Arthritis     all over body  . Anxiety     comes and goes  . Depression     comes and goes  . Dysrhythmia     recurrent arrhythmia w/AV replacement  . CHF (congestive heart failure) 02/10/11 note    has compensated CHF per Dr. Katrinka Blazing note  . Constipation 08/2009    after surgery  . Urinary tract bacterial infections 08/2010    from surgery and catheters    Past Surgical History  Procedure Laterality Date  . Cardiac valve replacement  2001  . Back surgery  2011-last one    6 surgeries  . Cardiac catheterization  2001    before valve replacement  . Joint replacement  08/2010    right knee  . Joint replacement  2004    left knee  . Transurethral incision of prostate  03/02/2011    Procedure: TRANSURETHRAL INCISION OF THE PROSTATE (TUIP);  Surgeon: [name];  Location: [facility];  Service: Urology;  Laterality: N/A;  . Esophageal biopsy  03/02/2011    Procedure: BIOPSY;  Surgeon: [name];  Location: [facility];  Service: Urology;;  . Video bronchoscopy Bilateral 05/31/2012    Procedure: VIDEO BRONCHOSCOPY WITHOUT  FLUORO;  Surgeon: Barbaraann Share, MD;  Location: [facility];  Service: Cardiopulmonary;  Laterality: Bilateral;  . Talc pleurodesis Right 12/25/2012    Procedure: Lurlean Nanny;  Surgeon: Delight Ovens, MD;  Location: [facility] OR;  Service: Thoracic;  Laterality: Right;    No family history on file.  History   Social History  . Marital Status: Married    Spouse Name: N/A    Number of Children: N/A  . Years of Education: N/A   Occupational History  . Not on file.   Social History Main Topics  . Smoking status: Former Smoker -- 1.00 packs/day for 30 years    Types: Cigarettes    Quit date: 02/23/1984  . Smokeless tobacco: Not on file  . Alcohol Use: No  . Drug Use: No  . Sexual Activity:    Other  Topics Concern  . Not on file   Social History Narrative  . No narrative on file    History  Smoking status  . Former Smoker -- 1.00 packs/day for 30 years  . Types: Cigarettes  . Quit date: 02/23/1984  Smokeless tobacco  . Not on file    History  Alcohol Use No     Allergies  Allergen Reactions  . Penicillins Other (See Comments)    Pt doesn't remember the reaction  . Warfarin And Related Other (See Comments)    Pt gets the shakes    Current Outpatient Prescriptions  Medication Sig Dispense Refill  . amiodarone (PACERONE) 200 MG tablet Take 100 mg by mouth daily. Pt takes (0.5 tab) for 100mg  dosage      . atorvastatin (LIPITOR) 10 MG tablet Take 10 mg by mouth every morning.      . calcium-vitamin D (OSCAL WITH D) 500-200 MG-UNIT per tablet Take 1 tablet by mouth daily.      . clonazePAM (KLONOPIN) 0.5 MG tablet Take 0.5 mg by mouth 2 (two) times daily as needed. For anxiety      . dabigatran (PRADAXA) 150 MG CAPS Take 150 mg by mouth every 12 (twelve) hours.       Marland Kitchen levothyroxine (SYNTHROID, LEVOTHROID) 50 MCG tablet Take 50 mcg by mouth every morning.       . metoprolol (TOPROL-XL) 50 MG 24 hr tablet Take 25 mg by mouth every morning.       . Misc Natural Products (OSTEO BI-FLEX ADV DOUBLE ST PO) Take 1 tablet by mouth 2 (two) times daily.       . phenytoin (DILANTIN) 100 MG ER capsule TAKE 2 CAPSULES TWICE A DAY  360 capsule  1  . Polyethyl Glycol-Propyl Glycol (SYSTANE OP) Apply 1 drop to eye as needed. For dry eyes      . polyethylene glycol (MIRALAX / GLYCOLAX) packet Take 17 g by mouth daily.      Marland Kitchen rOPINIRole (REQUIP) 0.5 MG tablet Take 0.5 mg by mouth 3 (three) times daily.       . sertraline (ZOLOFT) 50 MG tablet Take 200 mg by mouth 2 (two) times daily.       . vitamin C (ASCORBIC ACID) 500 MG tablet Take 500 mg by mouth daily.      Marland Kitchen HYDROcodone-acetaminophen (NORCO/VICODIN) 5-325 MG per tablet Take 1 tablet by mouth every 6 (six) hours as needed for pain.  50  tablet  0  . mirabegron ER (MYRBETRIQ) 50 MG TB24 Take 50 mg by mouth daily.      Marland Kitchen oxyCODONE-acetaminophen (PERCOCET) 10-325 MG per  tablet Take 1 tablet by mouth every 4 (four) hours as needed for pain.  30 tablet  0   No current facility-administered medications for this visit.      Physical Exam: BP 112/64  Pulse 107  Resp 18  Ht 5\' 6"  (1.676 m)  Wt 160 lb (72.576 kg)  BMI 25.84 kg/m2  SpO2 95%  General appearance: alert, cooperative, appears stated age and no distress Neurologic: intact Heart: regular rate and rhythm, S1, S2 normal, no murmur, click, rub or gallop and normal apical impulse Lungs: clear to auscultation bilaterally Abdomen: soft, non-tender; bowel sounds normal; no masses,  no organomegaly Extremities: extremities normal, atraumatic, no cyanosis or edema and Homans sign is negative, no sign of DVT Patient has no cervical supraclavicular or axillary adenopathy, he has no pedal edema, pedal pulses are intact bilaterally   Diagnostic Studies & Laboratory data:     Recent Radiology Findings:  Dg Chest 2 View  01/03/2013   *RADIOLOGY REPORT*  Clinical Data: History of malignant right pleural effusion with adenocarcinoma  CHEST - 2 VIEW  Comparison: Chest x-ray of 12/20/2012  Findings: There has been definite interval increase in volume of the right pleural effusion with right basilar volume loss.  A right pleurex catheter is noted.  There may be a tiny left effusion present.  Mild cardiomegaly is stable.  Median sternotomy sutures are noted and an aortic valve replacement is present.  IMPRESSION: Interval increase in volume of the right pleural effusion with increase in right basilar volume loss.   Original Report Authenticated By: Dwyane Dee, M.D.     11/23/2012   *RADIOLOGY REPORT*  Clinical Data: Follow up pleural effusion, history mesothelioma, coronary artery disease, CHF, smoking  CHEST - 2 VIEW  Comparison: 10/05/2012  Findings: Right basilar thoracostomy tube.  Enlargement of cardiac silhouette post median sternotomy and AVR. Mediastinal contours and pulmonary vascularity normal. Scattered pleural plaques, partially calcified by prior CT, consistent with asbestos exposure. Blunting of the right costophrenic angle by chronic effusion or pleural thickening. No definite acute infiltrate, pneumothorax, or acute osseous findings. Prior lumbar fusion.  IMPRESSION: Chronic pleural changes as above. Enlargement of cardiac silhouette post AVR.   Original Report Authenticated By: Ulyses Southward, M.D.   Recent Lab Findings: Lab Results  Component Value Date   WBC 9.1 05/18/2012   HGB 14.8 05/18/2012   HCT 42.9 05/18/2012   PLT 210 05/18/2012   GLUCOSE 83 05/18/2012   ALT 12 06/07/2008   AST 28 06/07/2008   NA 138 05/18/2012   K 4.4 05/18/2012   CL 105 05/18/2012   CREATININE 1.29 05/18/2012   BUN 25* 05/18/2012   CO2 24 05/18/2012   TSH 7.497* 06/17/2010   INR 1.27 05/18/2012      Assessment / Plan:   The patient's Pleurx is now draining no fluid, unfortunately he is accumulating fluid probably loculated in the right chest. Before removing the current Pleurx we will obtain a CT scan of the chest and have him return tomorrow with the drainage bag for his thoracic catheter.    Delight Ovens  MD  Beeper [phone] Office [phone] 01/05/2013 3:25 PM

## 2013-01-05 NOTE — Patient Instructions (Signed)
Stop Pradaxa on Sunday September 21

## 2013-01-05 NOTE — Telephone Encounter (Signed)
Call from [facility] at Dr. Dennie Maizes office. Dr. Tyrone Sage is requesting sooner appt for pt to discuss possibility for chemo tx. Had CT 9/18. Pt is being seen today to address loculated pleural effusion. May need Inspira drain replaced. No longer draining after having talc procedure.

## 2013-01-05 NOTE — Progress Notes (Signed)
[address]       [place] [address]             [phone]                      [name] Medical Record #[id number] Date of Birth: 02-09-1928  Referring: Dr Myrle Sheng Primary Care: Darnelle Bos, MD  Chief Complaint:   Malignant pleural effusion with Pleurx catheter in place right chest  History of Present Illness:   Patient returns today with the drainage system for his pleural catheter. He obtained a followup CT scan last night. He is slightly more symptomatic with shortness of breath with exertion than several weeks ago.  Today in the office no fluid could be drained from the catheter     Current Activity/ Functional Status:  Patient is independent with mobility/ambulation, transfers, ADL's, IADL's.  Zubrod Score: At the time of surgery this patient's most appropriate activity status/level should be described as: []  Normal activity, no symptoms [x]  Symptoms, fully ambulatory []  Symptoms, in bed less than or equal to 50% of the time []  Symptoms, in bed greater than 50% of the time but less than 100% []  Bedridden []  Moribund   Past Medical History  Diagnosis Date  . Hypothyroidism     takes Levothyroxine  . Coronary artery disease     valve replaced  . Sleep apnea 2011    does not use CPAP  . Seizures     cannot remember last seizure  . Arthritis     all over body  . Anxiety     comes and goes  . Depression     comes and goes  . Dysrhythmia     recurrent arrhythmia w/AV replacement  . CHF (congestive heart failure) 02/10/11 note    has compensated CHF per Dr. Katrinka Blazing note  . Constipation 08/2009    after surgery  . Urinary tract bacterial infections 08/2010    from surgery and catheters    Past Surgical History  Procedure Laterality Date  . Cardiac valve replacement  2001  . Back surgery  2011-last one    6 surgeries  . Cardiac catheterization  2001    before valve replacement  . Joint  replacement  08/2010    right knee  . Joint replacement  2004    left knee  . Transurethral incision of prostate  03/02/2011    Procedure: TRANSURETHRAL INCISION OF THE PROSTATE (TUIP);  Surgeon: [name];  Location: [facility];  Service: Urology;  Laterality: N/A;  . Esophageal biopsy  03/02/2011    Procedure: BIOPSY;  Surgeon: [name];  Location: [facility];  Service: Urology;;  . Video bronchoscopy Bilateral 05/31/2012    Procedure: VIDEO BRONCHOSCOPY WITHOUT FLUORO;  Surgeon: Barbaraann Share, MD;  Location: [facility];  Service: Cardiopulmonary;  Laterality: Bilateral;  . Talc pleurodesis Right 12/25/2012    Procedure: Lurlean Nanny;  Surgeon: Delight Ovens, MD;  Location: [facility] OR;  Service: Thoracic;  Laterality: Right;    No family history on file.  History   Social History  . Marital Status: Married    Spouse Name: N/A    Number of Children: N/A  . Years of Education: N/A   Occupational History  . Not on file.   Social History Main Topics  . Smoking status: Former Smoker -- 1.00 packs/day for 30 years  Types: Cigarettes    Quit date: 02/23/1984  . Smokeless tobacco: Not on file  . Alcohol Use: No  . Drug Use: No  . Sexual Activity:    Other Topics Concern  . Not on file   Social History Narrative  . No narrative on file    History  Smoking status  . Former Smoker -- 1.00 packs/day for 30 years  . Types: Cigarettes  . Quit date: 02/23/1984  Smokeless tobacco  . Not on file    History  Alcohol Use No     Allergies  Allergen Reactions  . Penicillins Other (See Comments)    Pt doesn't remember the reaction  . Warfarin And Related Other (See Comments)    Pt gets the shakes    Current Outpatient Prescriptions  Medication Sig Dispense Refill  . amiodarone (PACERONE) 200 MG tablet Take 100 mg by mouth daily. Pt takes (0.5 tab) for 100mg  dosage      . atorvastatin (LIPITOR) 10 MG tablet Take 10 mg by mouth every morning.      . calcium-vitamin D  (OSCAL WITH D) 500-200 MG-UNIT per tablet Take 1 tablet by mouth daily.      . clonazePAM (KLONOPIN) 0.5 MG tablet Take 0.5 mg by mouth 2 (two) times daily as needed. For anxiety      . dabigatran (PRADAXA) 150 MG CAPS Take 150 mg by mouth every 12 (twelve) hours.       Marland Kitchen HYDROcodone-acetaminophen (NORCO/VICODIN) 5-325 MG per tablet Take 1 tablet by mouth every 6 (six) hours as needed for pain.  50 tablet  0  . levothyroxine (SYNTHROID, LEVOTHROID) 50 MCG tablet Take 50 mcg by mouth every morning.       . metoprolol (TOPROL-XL) 50 MG 24 hr tablet Take 25 mg by mouth every morning.       . mirabegron ER (MYRBETRIQ) 50 MG TB24 Take 50 mg by mouth daily.      . Misc Natural Products (OSTEO BI-FLEX ADV DOUBLE ST PO) Take 1 tablet by mouth 2 (two) times daily.       Marland Kitchen oxyCODONE-acetaminophen (PERCOCET) 10-325 MG per tablet Take 1 tablet by mouth every 4 (four) hours as needed for pain.  30 tablet  0  . phenytoin (DILANTIN) 100 MG ER capsule TAKE 2 CAPSULES TWICE A DAY  360 capsule  1  . Polyethyl Glycol-Propyl Glycol (SYSTANE OP) Apply 1 drop to eye as needed. For dry eyes      . polyethylene glycol (MIRALAX / GLYCOLAX) packet Take 17 g by mouth daily.      Marland Kitchen rOPINIRole (REQUIP) 0.5 MG tablet Take 0.5 mg by mouth 3 (three) times daily.       . sertraline (ZOLOFT) 50 MG tablet Take 200 mg by mouth 2 (two) times daily.       . vitamin C (ASCORBIC ACID) 500 MG tablet Take 500 mg by mouth daily.       No current facility-administered medications for this visit.      Physical Exam: There were no vitals taken for this visit.  General appearance: alert, cooperative, appears stated age and no distress Neurologic: intact Heart: regular rate and rhythm, S1, S2 normal, no murmur, click, rub or gallop and normal apical impulse Lungs: clear to auscultation bilaterally Abdomen: soft, non-tender; bowel sounds normal; no masses,  no organomegaly Extremities: extremities normal, atraumatic, no cyanosis or  edema and Homans sign is negative, no sign of DVT Patient has no cervical supraclavicular or  axillary adenopathy, he has no pedal edema, pedal pulses are intact bilaterally   Diagnostic Studies & Laboratory data:     Recent Radiology Findings:  Ct Chest Wo Contrast  01/05/2013   CLINICAL DATA:  Follow up malignant pleural effusion status post drainage tube placement. History of mesothelioma.  EXAM: CT CHEST WITHOUT CONTRAST  TECHNIQUE: Multidetector CT imaging of the chest was performed following the standard protocol without IV contrast.  COMPARISON:  Radiographs 01/03/2013, PET-CT 05/19/2012 and chest CT 04/26/2012.  FINDINGS: There is increased right pericardiac nodularity, measuring up to 1.8 cm on image 42. As evaluated in the non contrast state, the mediastinum otherwise has a stable appearance without pathologically enlarged lymph nodes. Scattered small lymph nodes are unchanged. There is scattered atherosclerosis of the aorta, great vessels and coronary arteries status post median sternotomy and aortic valve replacement.  Right PleurX catheter extends to the posterior right costophrenic angle. A partially loculated right pleural effusion extends into the fissures and has decreased in volume compared with the prior CT. There is some dependent pleural thickening without obvious dominant pleural-based mass on non contrast imaging. There is some high-density inferiorly in the right pleural space which may be secondary to prior pleurodesis. No significant pleural effusion is present on the left. Pleural calcifications are again noted. The right pericardiac nodularity may involve the pericardium, although there is no significant pericardial effusion.  There is partial right middle and lower lobe atelectasis with air bronchograms. No masses or nodules are seen within the aerated portions of the lungs.  The visualized upper abdomen appears stable. There are stable compression deformities at T4 and T8. No  epidural tumor is identified.  IMPRESSION: 1. Right PleurX catheter appears well positioned. There is a moderate size loculated right pleural effusion with pleural thickening, but no obvious pleural-based mass on non contrast imaging. 2. Increased right pericardiac nodularity suspicious for tumor progression. No other discrete tumor visualized. 3. Interval improved aeration of the right middle and lower lobes. 4. Stable thoracic compression deformities.   Electronically Signed   By: Roxy Horseman   On: 01/05/2013 08:16   Dg Chest 2 View  01/03/2013   *RADIOLOGY REPORT*  Clinical Data: History of malignant right pleural effusion with adenocarcinoma  CHEST - 2 VIEW  Comparison: Chest x-ray of 12/20/2012  Findings: There has been definite interval increase in volume of the right pleural effusion with right basilar volume loss.  A right pleurex catheter is noted.  There may be a tiny left effusion present.  Mild cardiomegaly is stable.  Median sternotomy sutures are noted and an aortic valve replacement is present.  IMPRESSION: Interval increase in volume of the right pleural effusion with increase in right basilar volume loss.   Original Report Authenticated By: Dwyane Dee, M.D.     11/23/2012   *RADIOLOGY REPORT*  Clinical Data: Follow up pleural effusion, history mesothelioma, coronary artery disease, CHF, smoking  CHEST - 2 VIEW  Comparison: 10/05/2012  Findings: Right basilar thoracostomy tube. Enlargement of cardiac silhouette post median sternotomy and AVR. Mediastinal contours and pulmonary vascularity normal. Scattered pleural plaques, partially calcified by prior CT, consistent with asbestos exposure. Blunting of the right costophrenic angle by chronic effusion or pleural thickening. No definite acute infiltrate, pneumothorax, or acute osseous findings. Prior lumbar fusion.  IMPRESSION: Chronic pleural changes as above. Enlargement of cardiac silhouette post AVR.   Original Report Authenticated By: Ulyses Southward, M.D.   Recent Lab Findings: Lab Results  Component Value Date  WBC 9.1 05/18/2012   HGB 14.8 05/18/2012   HCT 42.9 05/18/2012   PLT 210 05/18/2012   GLUCOSE 83 05/18/2012   ALT 12 06/07/2008   AST 28 06/07/2008   NA 138 05/18/2012   K 4.4 05/18/2012   CL 105 05/18/2012   CREATININE 1.29 05/18/2012   BUN 25* 05/18/2012   CO2 24 05/18/2012   TSH 7.497* 06/17/2010   INR 1.27 05/18/2012      Assessment / Plan:   The patient's Pleurx is now draining no fluid, unfortunately he is accumulating fluid probably loculated in the right chest. Followup CT scan shows multiloculated complex effusion not draining through the Pleurx, there suggestion of progression of disease also. I recommended to the patient that we proceed with right video-assisted thoracoscopy drainage of fluid , talc pleurodesis, placement of pleural drainage tube, and possible pleural biopsy. He will hold his dabigatran and we will proceed with surgery on Tuesday.  Delight Ovens  MD  Beeper [phone] Office [phone] 01/05/2013 3:33 PM

## 2013-01-07 ENCOUNTER — Emergency Department (HOSPITAL_COMMUNITY): Payer: Medicare Other

## 2013-01-07 ENCOUNTER — Encounter (HOSPITAL_COMMUNITY): Payer: Self-pay | Admitting: Emergency Medicine

## 2013-01-07 ENCOUNTER — Inpatient Hospital Stay (HOSPITAL_COMMUNITY)
Admission: EM | Admit: 2013-01-07 | Discharge: 2013-01-13 | DRG: 167 | Disposition: A | Discharge status: Home / Self Care | Payer: Medicare Other | Attending: Cardiothoracic Surgery | Admitting: Cardiothoracic Surgery

## 2013-01-07 DIAGNOSIS — I509 Heart failure, unspecified: Secondary | ICD-10-CM | POA: Diagnosis present

## 2013-01-07 DIAGNOSIS — K5900 Constipation, unspecified: Secondary | ICD-10-CM | POA: Diagnosis not present

## 2013-01-07 DIAGNOSIS — Z23 Encounter for immunization: Secondary | ICD-10-CM

## 2013-01-07 DIAGNOSIS — Z96659 Presence of unspecified artificial knee joint: Secondary | ICD-10-CM

## 2013-01-07 DIAGNOSIS — I2510 Atherosclerotic heart disease of native coronary artery without angina pectoris: Secondary | ICD-10-CM

## 2013-01-07 DIAGNOSIS — J189 Pneumonia, unspecified organism: Secondary | ICD-10-CM | POA: Diagnosis present

## 2013-01-07 DIAGNOSIS — I4891 Unspecified atrial fibrillation: Secondary | ICD-10-CM | POA: Diagnosis present

## 2013-01-07 DIAGNOSIS — Z79899 Other long term (current) drug therapy: Secondary | ICD-10-CM

## 2013-01-07 DIAGNOSIS — M129 Arthropathy, unspecified: Secondary | ICD-10-CM | POA: Diagnosis present

## 2013-01-07 DIAGNOSIS — F322 Major depressive disorder, single episode, severe without psychotic features: Secondary | ICD-10-CM | POA: Diagnosis present

## 2013-01-07 DIAGNOSIS — Z87891 Personal history of nicotine dependence: Secondary | ICD-10-CM

## 2013-01-07 DIAGNOSIS — J910 Malignant pleural effusion: Secondary | ICD-10-CM | POA: Diagnosis present

## 2013-01-07 DIAGNOSIS — J90 Pleural effusion, not elsewhere classified: Secondary | ICD-10-CM | POA: Diagnosis present

## 2013-01-07 DIAGNOSIS — Z954 Presence of other heart-valve replacement: Secondary | ICD-10-CM

## 2013-01-07 DIAGNOSIS — C459 Mesothelioma, unspecified: Secondary | ICD-10-CM | POA: Diagnosis present

## 2013-01-07 DIAGNOSIS — F411 Generalized anxiety disorder: Secondary | ICD-10-CM | POA: Diagnosis present

## 2013-01-07 DIAGNOSIS — E039 Hypothyroidism, unspecified: Secondary | ICD-10-CM | POA: Diagnosis present

## 2013-01-07 DIAGNOSIS — G4730 Sleep apnea, unspecified: Secondary | ICD-10-CM | POA: Diagnosis present

## 2013-01-07 DIAGNOSIS — C384 Malignant neoplasm of pleura: Principal | ICD-10-CM | POA: Diagnosis present

## 2013-01-07 LAB — BASIC METABOLIC PANEL
CO2: 24 mEq/L (ref 19–32)
Calcium: 8.5 mg/dL (ref 8.4–10.5)
Chloride: 101 mEq/L (ref 96–112)
GFR calc Af Amer: 53 mL/min — ABNORMAL LOW (ref >90)
GFR calc non Af Amer: 46 mL/min — ABNORMAL LOW (ref >90)
Glucose, Bld: 142 mg/dL — ABNORMAL HIGH (ref 70–99)
Potassium: 4.7 mEq/L (ref 3.5–5.1)
Sodium: 136 mEq/L (ref 135–145)

## 2013-01-07 LAB — POCT I-STAT TROPONIN I: Troponin i, poc: 0.03 ng/mL (ref 0.00–0.08)

## 2013-01-07 LAB — CBC
Hemoglobin: 12.3 g/dL — ABNORMAL LOW (ref 13.0–17.0)
MCH: 32.5 pg (ref 26.0–34.0)
MCHC: 35.2 g/dL (ref 30.0–36.0)
MCV: 92.3 fL (ref 78.0–100.0)
RBC: 3.78 MIL/uL — ABNORMAL LOW (ref 4.22–5.81)
WBC: 13.1 10*3/uL — ABNORMAL HIGH (ref 4.0–10.5)

## 2013-01-07 LAB — PRO B NATRIURETIC PEPTIDE: Pro B Natriuretic peptide (BNP): 6792 pg/mL — ABNORMAL HIGH (ref 0–450)

## 2013-01-07 NOTE — ED Notes (Addendum)
C/o SOB x 3-4 days.  History of pleural effusion.  Pt is scheduled for surgery (this week) to have current Pleurx removed, bronchoscopy, place TALC, and new drainage tube placed.  Pt states he is very depressed over his pain and his wife's pain.  He is asking for depression medication.  Breathing labored at this time.  Pt speaking in short phrases.

## 2013-01-07 NOTE — ED Notes (Signed)
Pt brought in by a neighbor a PT for [facility] who is working with pt's wife Tomma Lightning" [phone]), wife has dementia, (others with wife), son lives in [place] (son coming in tomorrow), pt has Mesothelioma, was to see Dr. Earl Gala (PCP) Tuesday to be cleared for CVTS surgery (Dr. Orson Aloe CV aware of pt)

## 2013-01-08 ENCOUNTER — Inpatient Hospital Stay (HOSPITAL_COMMUNITY): Payer: Medicare Other

## 2013-01-08 ENCOUNTER — Inpatient Hospital Stay (HOSPITAL_COMMUNITY): Admission: RE | Admit: 2013-01-08 | Payer: Medicare Other | Source: Ambulatory Visit

## 2013-01-08 ENCOUNTER — Encounter: Payer: Self-pay | Admitting: *Deleted

## 2013-01-08 DIAGNOSIS — J90 Pleural effusion, not elsewhere classified: Secondary | ICD-10-CM

## 2013-01-08 DIAGNOSIS — F322 Major depressive disorder, single episode, severe without psychotic features: Secondary | ICD-10-CM | POA: Diagnosis present

## 2013-01-08 DIAGNOSIS — C459 Mesothelioma, unspecified: Secondary | ICD-10-CM | POA: Diagnosis present

## 2013-01-08 DIAGNOSIS — J189 Pneumonia, unspecified organism: Secondary | ICD-10-CM | POA: Diagnosis present

## 2013-01-08 DIAGNOSIS — J910 Malignant pleural effusion: Secondary | ICD-10-CM

## 2013-01-08 DIAGNOSIS — I2510 Atherosclerotic heart disease of native coronary artery without angina pectoris: Secondary | ICD-10-CM

## 2013-01-08 LAB — COMPREHENSIVE METABOLIC PANEL
ALT: 20 U/L (ref 0–53)
Alkaline Phosphatase: 85 U/L (ref 39–117)
BUN: 20 mg/dL (ref 6–23)
Calcium: 8.3 mg/dL — ABNORMAL LOW (ref 8.4–10.5)
Chloride: 104 mEq/L (ref 96–112)
GFR calc Af Amer: 58 mL/min — ABNORMAL LOW (ref >90)
Glucose, Bld: 97 mg/dL (ref 70–99)
Potassium: 4.5 mEq/L (ref 3.5–5.1)
Sodium: 136 mEq/L (ref 135–145)
Total Bilirubin: 0.1 mg/dL — ABNORMAL LOW (ref 0.3–1.2)
Total Protein: 5.9 g/dL — ABNORMAL LOW (ref 6.0–8.3)

## 2013-01-08 LAB — BLOOD GAS, ARTERIAL
Acid-base deficit: 0.7 mmol/L (ref 0.0–2.0)
Bicarbonate: 23 mEq/L (ref 20.0–24.0)
Drawn by: 313941
O2 Content: 2 L/min
O2 Saturation: 95.4 %
Patient temperature: 98.6
TCO2: 24.1 mmol/L (ref 0–100)
pCO2 arterial: 35.2 mmHg (ref 35.0–45.0)
pH, Arterial: 7.431 (ref 7.350–7.450)
pO2, Arterial: 73 mmHg — ABNORMAL LOW (ref 80.0–100.0)

## 2013-01-08 LAB — URINE MICROSCOPIC-ADD ON

## 2013-01-08 LAB — CBC
HCT: 31.3 % — ABNORMAL LOW (ref 39.0–52.0)
MCHC: 33.9 g/dL (ref 30.0–36.0)
MCV: 92.9 fL (ref 78.0–100.0)
Platelets: 430 10*3/uL — ABNORMAL HIGH (ref 150–400)
RDW: 13.3 % (ref 11.5–15.5)
WBC: 10.1 10*3/uL (ref 4.0–10.5)

## 2013-01-08 LAB — URINALYSIS, ROUTINE W REFLEX MICROSCOPIC
Bilirubin Urine: NEGATIVE
Glucose, UA: NEGATIVE mg/dL
Hgb urine dipstick: NEGATIVE
Ketones, ur: NEGATIVE mg/dL
Leukocytes, UA: NEGATIVE
Nitrite: NEGATIVE
Protein, ur: 30 mg/dL — AB
Specific Gravity, Urine: 1.023 (ref 1.005–1.030)
Urobilinogen, UA: 0.2 mg/dL (ref 0.0–1.0)
pH: 6 (ref 5.0–8.0)

## 2013-01-08 LAB — TYPE AND SCREEN
ABO/RH(D): A NEG
Antibody Screen: NEGATIVE

## 2013-01-08 LAB — APTT: aPTT: 60 seconds — ABNORMAL HIGH (ref 24–37)

## 2013-01-08 MED ORDER — INFLUENZA VAC SPLIT QUAD 0.5 ML IM SUSP
0.5000 mL | INTRAMUSCULAR | Status: AC
  Filled 2013-01-08: qty 0.5

## 2013-01-08 MED ORDER — SODIUM CHLORIDE 0.9 % IJ SOLN
3.0000 mL | Freq: Two times a day (BID) | INTRAMUSCULAR | Status: DC
  Administered 2013-01-08 (×2): 3 mL via INTRAVENOUS

## 2013-01-08 MED ORDER — LEVOTHYROXINE SODIUM 50 MCG PO TABS
50.0000 ug | ORAL_TABLET | Freq: Every day | ORAL | Status: DC
  Administered 2013-01-08 – 2013-01-13 (×6): 50 ug via ORAL
  Filled 2013-01-08 (×8): qty 1

## 2013-01-08 MED ORDER — SERTRALINE HCL 100 MG PO TABS
100.0000 mg | ORAL_TABLET | Freq: Two times a day (BID) | ORAL | Status: DC
  Administered 2013-01-08 – 2013-01-10 (×5): 100 mg via ORAL
  Filled 2013-01-08 (×9): qty 1

## 2013-01-08 MED ORDER — ROPINIROLE HCL 0.5 MG PO TABS
0.5000 mg | ORAL_TABLET | Freq: Three times a day (TID) | ORAL | Status: DC
  Administered 2013-01-08: 0.5 mg via ORAL
  Filled 2013-01-08 (×5): qty 1

## 2013-01-08 MED ORDER — SODIUM CHLORIDE 0.9 % IV SOLN
INTRAVENOUS | Status: DC
  Administered 2013-01-08: 02:00:00 via INTRAVENOUS

## 2013-01-08 MED ORDER — TETRAHYDROZOLINE HCL 0.05 % OP SOLN
1.0000 [drp] | Freq: Two times a day (BID) | OPHTHALMIC | Status: DC
  Administered 2013-01-08 – 2013-01-10 (×4): 1 [drp] via OPHTHALMIC
  Filled 2013-01-08 (×2): qty 15

## 2013-01-08 MED ORDER — POLYETHYLENE GLYCOL 3350 17 G PO PACK
8.5000 g | PACK | Freq: Every day | ORAL | Status: DC
  Administered 2013-01-08 – 2013-01-12 (×3): 8.5 g via ORAL
  Filled 2013-01-08 (×6): qty 1

## 2013-01-08 MED ORDER — VANCOMYCIN HCL 10 G IV SOLR
1250.0000 mg | Freq: Once | INTRAVENOUS | Status: AC
  Administered 2013-01-08: 1250 mg via INTRAVENOUS
  Filled 2013-01-08: qty 1250

## 2013-01-08 MED ORDER — METOPROLOL SUCCINATE ER 25 MG PO TB24
25.0000 mg | ORAL_TABLET | Freq: Every day | ORAL | Status: DC
  Administered 2013-01-08 – 2013-01-13 (×4): 25 mg via ORAL
  Filled 2013-01-08 (×6): qty 1

## 2013-01-08 MED ORDER — MORPHINE SULFATE 2 MG/ML IJ SOLN
2.0000 mg | INTRAMUSCULAR | Status: DC | PRN
  Administered 2013-01-08 (×4): 2 mg via INTRAVENOUS
  Filled 2013-01-08 (×4): qty 1

## 2013-01-08 MED ORDER — SODIUM CHLORIDE 0.9 % IV SOLN: 1.0000 g | INTRAVENOUS | Status: DC

## 2013-01-08 MED ORDER — DEXTROSE 5 % IV SOLN
2.0000 g | Freq: Two times a day (BID) | INTRAVENOUS | Status: DC
  Administered 2013-01-08 (×2): 2 g via INTRAVENOUS
  Filled 2013-01-08 (×4): qty 2

## 2013-01-08 MED ORDER — VANCOMYCIN HCL IN DEXTROSE 1-5 GM/200ML-% IV SOLN: 1000.0000 mg | INTRAVENOUS | Status: DC

## 2013-01-08 MED ORDER — PHENYTOIN SODIUM EXTENDED 100 MG PO CAPS
200.0000 mg | ORAL_CAPSULE | Freq: Two times a day (BID) | ORAL | Status: DC
  Administered 2013-01-08 – 2013-01-13 (×10): 200 mg via ORAL
  Filled 2013-01-08 (×13): qty 2

## 2013-01-08 MED ORDER — AMIODARONE HCL 100 MG PO TABS
100.0000 mg | ORAL_TABLET | Freq: Every day | ORAL | Status: DC
  Administered 2013-01-08 – 2013-01-13 (×5): 100 mg via ORAL
  Filled 2013-01-08 (×6): qty 1

## 2013-01-08 MED ORDER — ATORVASTATIN CALCIUM 10 MG PO TABS
10.0000 mg | ORAL_TABLET | Freq: Every morning | ORAL | Status: DC
  Administered 2013-01-08: 10 mg via ORAL
  Filled 2013-01-08 (×2): qty 1

## 2013-01-08 MED ORDER — CLONAZEPAM 0.5 MG PO TABS
0.5000 mg | ORAL_TABLET | Freq: Every day | ORAL | Status: DC
  Administered 2013-01-08 – 2013-01-13 (×5): 0.5 mg via ORAL
  Filled 2013-01-08 (×5): qty 1

## 2013-01-08 MED ORDER — LORAZEPAM 1 MG PO TABS
0.5000 mg | ORAL_TABLET | Freq: Once | ORAL | Status: AC
  Administered 2013-01-08: 0.5 mg via ORAL
  Filled 2013-01-08: qty 1

## 2013-01-08 MED ORDER — AZTREONAM 2 G IJ SOLR
2.0000 g | Freq: Once | INTRAMUSCULAR | Status: AC
  Administered 2013-01-08: 2 g via INTRAVENOUS
  Filled 2013-01-08: qty 2

## 2013-01-08 MED ORDER — ROPINIROLE HCL 0.5 MG PO TABS
0.5000 mg | ORAL_TABLET | Freq: Three times a day (TID) | ORAL | Status: DC
  Administered 2013-01-08 – 2013-01-13 (×15): 0.5 mg via ORAL
  Filled 2013-01-08 (×22): qty 1

## 2013-01-08 MED ORDER — VANCOMYCIN HCL IN DEXTROSE 750-5 MG/150ML-% IV SOLN
750.0000 mg | INTRAVENOUS | Status: DC
  Administered 2013-01-09: 750 mg via INTRAVENOUS
  Filled 2013-01-08: qty 150

## 2013-01-08 NOTE — ED Provider Notes (Signed)
CSN: [id number]     Arrival date & time 01/07/13  2157 History   First MD Initiated Contact with Patient 01/07/13 2243     Chief Complaint  Patient presents with  . Shortness of Breath   (Consider location/radiation/quality/duration/timing/severity/associated sxs/prior Treatment) HPI Patient presents to the emergency department with increasing shortness of breath, that began 2 days again.  The patient, states, that he has a history of mesothelioma and has a full drain in his right lung.  Patient, states, that he's had increasing depression as well.  The patient denies chest pain, nausea, vomiting, diarrhea, abdominal pain, back pain, headache, blurred vision, weakness, numbness, dizziness, or fever.  Patient, states, that he has not had any relief of the symptoms with the, pleural drain Past Medical History  Diagnosis Date  . Hypothyroidism     takes Levothyroxine  . Coronary artery disease     valve replaced  . Sleep apnea 2011    does not use CPAP  . Seizures     cannot remember last seizure  . Arthritis     all over body  . Anxiety     comes and goes  . Depression     comes and goes  . Dysrhythmia     recurrent arrhythmia w/AV replacement  . CHF (congestive heart failure) 02/10/11 note    has compensated CHF per Dr. Katrinka Blazing note  . Constipation 08/2009    after surgery  . Urinary tract bacterial infections 08/2010    from surgery and catheters   Past Surgical History  Procedure Laterality Date  . Cardiac valve replacement  2001  . Back surgery  2011-last one    6 surgeries  . Cardiac catheterization  2001    before valve replacement  . Joint replacement  08/2010    right knee  . Joint replacement  2004    left knee  . Transurethral incision of prostate  03/02/2011    Procedure: TRANSURETHRAL INCISION OF THE PROSTATE (TUIP);  Surgeon: [name];  Location: [facility];  Service: Urology;  Laterality: N/A;  . Esophageal biopsy  03/02/2011    Procedure: BIOPSY;  Surgeon:  [name];  Location: [facility];  Service: Urology;;  . Video bronchoscopy Bilateral 05/31/2012    Procedure: VIDEO BRONCHOSCOPY WITHOUT FLUORO;  Surgeon: Barbaraann Share, MD;  Location: [facility];  Service: Cardiopulmonary;  Laterality: Bilateral;  . Talc pleurodesis Right 12/25/2012    Procedure: Lurlean Nanny;  Surgeon: Delight Ovens, MD;  Location: [name] OR;  Service: Thoracic;  Laterality: Right;   No family history on file. History  Substance Use Topics  . Smoking status: Former Smoker -- 1.00 packs/day for 30 years    Types: Cigarettes    Quit date: 02/23/1984  . Smokeless tobacco: Not on file  . Alcohol Use: No    Review of Systems All other systems negative except as documented in the HPI. All pertinent positives and negatives as reviewed in the HPI. Allergies  Penicillins and Warfarin and related  Home Medications   Current Outpatient Rx  Name  Route  Sig  Dispense  Refill  . amiodarone (PACERONE) 200 MG tablet   Oral   Take 100 mg by mouth daily.          Marland Kitchen atorvastatin (LIPITOR) 10 MG tablet   Oral   Take 10 mg by mouth every morning.         . calcium-vitamin D (OSCAL WITH D) 500-200 MG-UNIT per tablet  Oral   Take 1 tablet by mouth daily.         . clonazePAM (KLONOPIN) 0.5 MG tablet   Oral   Take 0.5 mg by mouth daily.          . dabigatran (PRADAXA) 150 MG CAPS   Oral   Take 150 mg by mouth every 12 (twelve) hours.          Marland Kitchen levothyroxine (SYNTHROID, LEVOTHROID) 50 MCG tablet   Oral   Take 50 mcg by mouth every morning.          . metoprolol (TOPROL-XL) 50 MG 24 hr tablet   Oral   Take 25 mg by mouth daily.          . Misc Natural Products (OSTEO BI-FLEX ADV DOUBLE ST PO)   Oral   Take 1 tablet by mouth daily.          . phenytoin (DILANTIN) 100 MG ER capsule   Oral   Take 200 mg by mouth 2 (two) times daily.         . polyethylene glycol (MIRALAX / GLYCOLAX) packet   Oral   Take 8.5 g by mouth daily.          Marland Kitchen  rOPINIRole (REQUIP) 0.5 MG tablet   Oral   Take 0.5 mg by mouth every 8 (eight) hours.          . sertraline (ZOLOFT) 100 MG tablet   Oral   Take 200 mg by mouth 2 (two) times daily.          . Tetrahydrozoline HCl (VISINE OP)   Both Eyes   Place 1 drop into both eyes daily.         . vitamin C (ASCORBIC ACID) 500 MG tablet   Oral   Take 500 mg by mouth daily.          BP 116/64  Pulse 85  Temp(Src) 98.1 F (36.7 C) (Oral)  Resp 20  SpO2 99% Physical Exam  Nursing note and vitals reviewed. Constitutional: He is oriented to person, place, and time. He appears well-developed and well-nourished. No distress.  HENT:  Head: Normocephalic and atraumatic.  Mouth/Throat: Oropharynx is clear and moist.  Eyes: Pupils are equal, round, and reactive to light.  Neck: Normal range of motion. Neck supple.  Cardiovascular: Normal rate, regular rhythm and normal heart sounds.  Exam reveals no gallop and no friction rub.   No murmur heard. Pulmonary/Chest: He is in respiratory distress. He has decreased breath sounds in the right upper field, the right middle field and the right lower field. He has rhonchi. He has no rales.  Neurological: He is alert and oriented to person, place, and time. He exhibits normal muscle tone. Coordination normal.  Skin: Skin is warm and dry. No rash noted. No erythema.    ED Course  Procedures (including critical care time) Labs Review Labs Reviewed  CBC - Abnormal; Notable for the following:    WBC 13.1 (*)    RBC 3.78 (*)    Hemoglobin 12.3 (*)    HCT 34.9 (*)    Platelets 555 (*)    All other components within normal limits  BASIC METABOLIC PANEL - Abnormal; Notable for the following:    Glucose, Bld 142 (*)    Creatinine, Ser 1.39 (*)    GFR calc non Af Amer 46 (*)    GFR calc Af Amer 53 (*)    All other components within  normal limits  PRO B NATRIURETIC PEPTIDE - Abnormal; Notable for the following:    Pro B Natriuretic peptide (BNP)  6792.0 (*)    All other components within normal limits  URINALYSIS, ROUTINE W REFLEX MICROSCOPIC  POCT I-STAT TROPONIN I   Imaging Review Dg Chest Port 1 View  01/07/2013   CLINICAL DATA:  Shortness of breath  EXAM: PORTABLE CHEST - 1 VIEW  COMPARISON:  01/03/2013  FINDINGS: Stable heart size. Status post aortic valve replacement.  Enlarging right pleural effusion, now large in volume, with extensive underlying passive atelectasis. Patchy interstitial opacities at the left base are new from prior. No overt edema based on the left upper lobe vascularity. No pneumothorax.  Unchanged positioning of a right base pleural catheter.  IMPRESSION: 1. Increased right pleural effusion, now large volume. A right pleural catheter is in stable position. 2. Small new opacity in the left lower lung. If there is clinical concern for infection, this could represent an early infectious infiltrate.   Electronically Signed   By: Tiburcio Pea   On: 01/07/2013 23:28   patient be admitted to the hospital for increasing pleural effusion and possible.  Healthcare associated pneumonia.  Patient is advised of the findings.  Patient was very emotional.  Due to the fact that he feels, like his health is deteriorating and his wife is very ill, as well  MDM   1. Pleural effusion   2. Healthcare-associated pneumonia       Carlyle Dolly, PA-C 01/08/13 0117

## 2013-01-08 NOTE — Care Management Note (Signed)
    Page 1 of 2   01/13/2013     3:41:49 PM   CARE MANAGEMENT NOTE 01/13/2013  Patient:  [name], [name]   Account Number:  [id number]  Date Initiated:  01/08/2013  Documentation initiated by:  AMERSON,JULIE  Subjective/Objective Assessment:   PT ADM ON 01/07/13 WITH PNA.  PTA, PT LIVES WITH WIFE, WHO REPORTEDLY HAS DEMENTIA.  PT HAS PLEURX CATHETER.     Action/Plan:   WILL FOLLOW FOR DISCHARGE NEEDS AS PT PROGRESSES.   Anticipated DC Date:  01/11/2013   Anticipated DC Plan:  HOME W HOME HEALTH SERVICES      DC Planning Services  CM consult      [facility] Choice  HOME HEALTH   Choice offered to / List presented to:  C-1 Patient   DME arranged  OTHER - SEE COMMENT  [facility]      DME agency  OTHER - SEE NOTE  [facility]     HH arranged  HH-1 RN  HH-2 PT  HH-4 NURSE'S AIDE      HH agency  [facility]   Status of service:  Completed, signed off Medicare Important Message given?   (If response is "NO", the following Medicare IM given date fields will be blank) Date Medicare IM given:   Date Additional Medicare IM given:    Discharge Disposition:  HOME W HOME HEALTH SERVICES  Per UR Regulation:  Reviewed for med. necessity/level of care/duration of stay  If discussed at Long Length of Stay Meetings, dates discussed:    Comments:  01/13/13 15:35 CM followup forHH RN drain schedule orders from MD [id number]. CM called [facility] to notify of schedule and pt discharge.  No other CM needs communicated.  Freddy Jaksch, BSN, CM [phone].   ContactIzaya, [name] Spouse [phone]                  [name] - [phone]  01/12/13 JULIE  AMERSON,RN,BSN [phone] PT FOR POSSIBLE DC OVER THE WEEKEND.  WILL NEED HH FOLLOW UP; REFERRAL TO [facility].  START OF CARE 24-48H POST DC DATE. REFERRAL TO [facility] FOR DME NEEDS--PT REQUESTS RW FOR HOME. COMPLETED PLEURX DRAINAGE ORDER FORMS AND FAXED TO [facility], PER PROTOCOL, TO ENSURE FURTHER DELIVERY OF  SUPPLIES.   BEDSIDE NURSE TO ORDER CASE OF DRAINAGE KITS FOR PT TO DC HOME WITH.  01-10-13 11:20am Avie Arenas, RNBSN [phone] Patient lives with his wife.  he is the caregiver - states wife in bad shape.  Son is with wife now. have been married for 68 years and states they are both breaking down.  Wife has PT with [facility].  Has had pleurix prior and he and his wife have taken care of but agreeable for [facility] nurse to assist with at this time.  Would like to continue with [facility]. Pleurix forms started.  So [facility] - referral made.   Will need order for [facility] RN on discharge.  CM will continue to follow for further needs.

## 2013-01-08 NOTE — Progress Notes (Addendum)
Subjective:  [name] complains of depression this morning.  Patient is tearful through most of visit.  He does have some mild shortness of breath which is chronic but has increased over the past several days.  However patient is more concerned about wife's health.  Objective: Vital signs in last 24 hours: Temp:  [97.7 F (36.5 C)-98.5 F (36.9 C)] 98.5 F (36.9 C) (09/22 0227) Pulse Rate:  [80-93] 84 (09/22 0425) Cardiac Rhythm:  [-]  Resp:  [15-24] 20 (09/22 0425) BP: (106-127)/(49-74) 116/64 mmHg (09/22 0425) SpO2:  [93 %-99 %] 96 % (09/22 0425) Weight:  [164 lb (74.39 kg)] 164 lb (74.39 kg) (09/22 0227)  Intake/Output from previous day: 09/21 0701 - 09/22 0700 In: 593.8 [I.V.:293.8; IV Piggyback:300] Out: -   General appearance: alert and mild distress Heart: regular rate and rhythm Lungs: diminished breath sounds on right  Lab Results:  Recent Labs  01/07/13 2211 01/08/13 0625  WBC 13.1* 10.1  HGB 12.3* 10.6*  HCT 34.9* 31.3*  PLT 555* 430*   BMET:  Recent Labs  01/07/13 2211 01/08/13 0625  NA 136 136  K 4.7 4.5  CL 101 104  CO2 24 21  GLUCOSE 142* 97  BUN 22 20  CREATININE 1.39* 1.28  CALCIUM 8.5 8.3*    PT/INR:  Recent Labs  01/08/13 0625  LABPROT 20.3*  INR 1.79*    Assessment/Plan:  1. Mesothelioma- patient with Pleur-x catheter placed by [facility] in January- this is no longer functioning and pleural fluid is increased and loculated.  2. Depression- care per medicine 3. Suspected Pneumonia- on IV ABX per medicine, however patient remains afebrile, no leukocytosis, no sputum production, blood cultures pending 4. Dispo- Dr. Tyrone Sage is aware of patient, will assess this evening, surgery remains tentatively scheduled for tomorrow morning, NPO after midnight   LOS: 1 day    Raford Pitcher, ERIN 01/08/2013  Patient seen in office Friday, will continue with plan for rt VATS, drainage of complex effusion. The goals risks and alternatives  of the planned surgical procedure bronchoscopy,  RT VATS, talc pleurodesis removal of nonfunctional pleural catheter and replacement. Poss pleural BX  have been discussed with the patient in detail. The risks of the procedure including death, infection, stroke, myocardial infarction, bleeding, blood transfusion have all been discussed specifically.  I have quoted [name] a 4% of perioperative mortality and a complication rate as high as 25 %. The patient's questions have been answered.[name] is willing  to proceed with the planned procedure.

## 2013-01-08 NOTE — Progress Notes (Signed)
CSW received referral for home health for patient. CSW referred this on to Case Manager.  Maree Krabbe, MSW, Theresia Majors [phone]

## 2013-01-08 NOTE — ED Provider Notes (Signed)
Medical screening examination/treatment/procedure(s) were conducted as a shared visit with non-physician practitioner(s) and myself.  I personally evaluated the patient during the encounter  Olivia Mackie, MD 01/08/13 [phone]

## 2013-01-08 NOTE — Progress Notes (Signed)
Assessment/Plan: Principal Problem:   HCAP (healthcare-associated pneumonia) - left lower lobe infiltrate c/w possible pneumonia. On abx.  Active Problems:   Pleural effusion -    Mesothelioma - this is the presumed diagnosis (or adenocarcinoma of unknown primary with mesothelial markers). He was set for VATS and other things on 9/23. I understand that on-call physician for Dr. Tyrone Sage was contacted. I will confirm.    Major depressive disorder, single episode, severe, without mention of psychotic behavior - this is a terrible situation. His wife has a moderate dementia and has severe pain as indicated below. He is desperate for her to have some relief. Their family lives out of town and they don't really have anybody to help them here. Friends have been trying to assist but are limited in their ability to do so.   Subjective: Patient in tears. Was in ED until about 0430 and then IV device made noise through night and he was not able to sleep. He is exhausted. He is depressed about his condition and his wife's condition (moderate dementia with severe lumbar radicular pain with ambulation not helped by recent surgery; various meds have not helped so far). Their children do not live locally, though one son, Onalee Hua, is to arrive tomorrow due to patient's planned surgery.   Objective:  Vital Signs: Filed Vitals:   01/08/13 0100 01/08/13 0130 01/08/13 0227 01/08/13 0425  BP: 118/63 115/67 127/74 116/64  Pulse: 87 84 86 84  Temp:   98.5 F (36.9 C)   TempSrc:   Oral   Resp: 18 18 19 20   Height:   5\' 6"  (1.676 m)   Weight:   74.39 kg (164 lb)   SpO2: 95% 95% 94% 96%     EXAM: Tearful.    Intake/Output Summary (Last 24 hours) at 01/08/13 0757 Last data filed at 01/08/13 0600  Gross per 24 hour  Intake 593.75 ml  Output      0 ml  Net 593.75 ml    Lab Results:  Recent Labs  01/07/13 2211 01/08/13 0625  NA 136 136  K 4.7 4.5  CL 101 104  CO2 24 21  GLUCOSE 142* 97  BUN 22 20   CREATININE 1.39* 1.28  CALCIUM 8.5 8.3*    Recent Labs  01/08/13 0625  AST 22  ALT 20  ALKPHOS 85  BILITOT 0.1*  PROT 5.9*  ALBUMIN 2.2*   No results found for this basename: LIPASE, AMYLASE,  in the last 72 hours  Recent Labs  01/07/13 2211 01/08/13 0625  WBC 13.1* 10.1  HGB 12.3* 10.6*  HCT 34.9* 31.3*  MCV 92.3 92.9  PLT 555* 430*   No results found for this basename: CKTOTAL, CKMB, CKMBINDEX, TROPONINI,  in the last 72 hours No components found with this basename: POCBNP,  No results found for this basename: DDIMER,  in the last 72 hours No results found for this basename: HGBA1C,  in the last 72 hours No results found for this basename: CHOL, HDL, LDLCALC, TRIG, CHOLHDL, LDLDIRECT,  in the last 72 hours No results found for this basename: TSH, T4TOTAL, FREET3, T3FREE, THYROIDAB,  in the last 72 hours No results found for this basename: VITAMINB12, FOLATE, FERRITIN, TIBC, IRON, RETICCTPCT,  in the last 72 hours  Studies/Results: Dg Chest Port 1 View  01/07/2013   CLINICAL DATA:  Shortness of breath  EXAM: PORTABLE CHEST - 1 VIEW  COMPARISON:  01/03/2013  FINDINGS: Stable heart size. Status post aortic valve replacement.  Enlarging right pleural effusion, now large in volume, with extensive underlying passive atelectasis. Patchy interstitial opacities at the left base are new from prior. No overt edema based on the left upper lobe vascularity. No pneumothorax.  Unchanged positioning of a right base pleural catheter.  IMPRESSION: 1. Increased right pleural effusion, now large volume. A right pleural catheter is in stable position. 2. Small new opacity in the left lower lung. If there is clinical concern for infection, this could represent an early infectious infiltrate.   Electronically Signed   By: Tiburcio Pea   On: 01/07/2013 23:28   Medications: Medications administered in the last 24 hours reviewed.  Current Medication List reviewed.    LOS: 1 day    [facility] Internal [facility] @ [facility] [phone]) 01/08/2013, 7:57 AM

## 2013-01-08 NOTE — Progress Notes (Signed)
Refill/renewal for [employer] Drainage Kits and dressing supplies faxed to [facility] [phone] to renew for another 6 months.

## 2013-01-08 NOTE — H&P (Addendum)
[facility] ADMISSION H&P  Chief Complaint: SOB  HPI: 79 yr old WM w/ pmhx significant for mesothelioma s/p Pleurx catheter placement by Dr. Tyrone Sage, CAD, CHF, depression, presents due to SOB.  He was recently seen on 9/19 by Dr. Tyrone Sage and was complaining of increased SOB especially with exertion. At the time, no fluid could be drained from the catheter.  A CT of the chest performed shows multiloculated complex effusion.  It was recommended that he proceed with VATS drainage of the fluid, talc pleurodesis, and placement of a pleural drainage tube. The surgery was planned for Tuesday. In the ED, he was noted to have an O2 sat of 99% on 2 L [place]. He is tearful.  He is worried about his wife at home. His WBC is noted to be 13k and CXR shows a small new opacity in the Left lower lung. The right sided pleural effusion is larger.  Past Medical History  Diagnosis Date  . Hypothyroidism     takes Levothyroxine  . Coronary artery disease     valve replaced  . Sleep apnea 2011    does not use CPAP  . Seizures     cannot remember last seizure  . Arthritis     all over body  . Anxiety     comes and goes  . Depression     comes and goes  . Dysrhythmia     recurrent arrhythmia w/AV replacement  . CHF (congestive heart failure) 02/10/11 note    has compensated CHF per Dr. Katrinka Blazing note  . Constipation 08/2009    after surgery  . Urinary tract bacterial infections 08/2010    from surgery and catheters    Past Surgical History  Procedure Laterality Date  . Cardiac valve replacement  2001  . Back surgery  2011-last one    6 surgeries  . Cardiac catheterization  2001    before valve replacement  . Joint replacement  08/2010    right knee  . Joint replacement  2004    left knee  . Transurethral incision of prostate  03/02/2011    Procedure: TRANSURETHRAL INCISION OF THE PROSTATE (TUIP);  Surgeon: [name];  Location: [facility];  Service: Urology;  Laterality: N/A;  . Esophageal  biopsy  03/02/2011    Procedure: BIOPSY;  Surgeon: [name];  Location: [facility];  Service: Urology;;  . Video bronchoscopy Bilateral 05/31/2012    Procedure: VIDEO BRONCHOSCOPY WITHOUT FLUORO;  Surgeon: Barbaraann Share, MD;  Location: [facility];  Service: Cardiopulmonary;  Laterality: Bilateral;  . Talc pleurodesis Right 12/25/2012    Procedure: Lurlean Nanny;  Surgeon: Delight Ovens, MD;  Location: [facility] OR;  Service: Thoracic;  Laterality: Right;    No family history on file. Social History:  reports that he quit smoking about 28 years ago. His smoking use included Cigarettes. He has a 30 pack-year smoking history. He does not have any smokeless tobacco history on file. He reports that he does not drink alcohol or use illicit drugs.  Allergies:  Allergies  Allergen Reactions  . Penicillins Other (See Comments)    Pt doesn't remember the reaction  . Warfarin And Related Other (See Comments)    Pt gets the shakes     (Not in a hospital admission)  Results for orders placed during the hospital encounter of 01/07/13 (from the past 48 hour(s))  CBC     Status: Abnormal   Collection Time    01/07/13 10:11 PM  Result Value Range   WBC 13.1 (*) 4.0 - 10.5 K/uL   RBC 3.78 (*) 4.22 - 5.81 MIL/uL   Hemoglobin 12.3 (*) 13.0 - 17.0 g/dL   HCT 40.9 (*) 81.1 - 91.4 %   MCV 92.3  78.0 - 100.0 fL   MCH 32.5  26.0 - 34.0 pg   MCHC 35.2  30.0 - 36.0 g/dL   RDW 78.2  95.6 - 21.3 %   Platelets 555 (*) 150 - 400 K/uL  BASIC METABOLIC PANEL     Status: Abnormal   Collection Time    01/07/13 10:11 PM      Result Value Range   Sodium 136  135 - 145 mEq/L   Potassium 4.7  3.5 - 5.1 mEq/L   Chloride 101  96 - 112 mEq/L   CO2 24  19 - 32 mEq/L   Glucose, Bld 142 (*) 70 - 99 mg/dL   BUN 22  6 - 23 mg/dL   Creatinine, Ser 0.86 (*) 0.50 - 1.35 mg/dL   Calcium 8.5  8.4 - 57.8 mg/dL   GFR calc non Af Amer 46 (*) >90 mL/min   GFR calc Af Amer 53 (*) >90 mL/min   Comment: (NOTE)     The  eGFR has been calculated using the CKD EPI equation.     This calculation has not been validated in all clinical situations.     eGFR's persistently <90 mL/min signify possible Chronic Kidney     Disease.  PRO B NATRIURETIC PEPTIDE     Status: Abnormal   Collection Time    01/07/13 10:11 PM      Result Value Range   Pro B Natriuretic peptide (BNP) 6792.0 (*) 0 - 450 pg/mL  POCT I-STAT TROPONIN I     Status: None   Collection Time    01/07/13 10:38 PM      Result Value Range   Troponin i, poc 0.03  0.00 - 0.08 ng/mL   Comment 3            Comment: Due to the release kinetics of cTnI,     a negative result within the first hours     of the onset of symptoms does not rule out     myocardial infarction with certainty.     If myocardial infarction is still suspected,     repeat the test at appropriate intervals.   Dg Chest Port 1 View  01/07/2013   CLINICAL DATA:  Shortness of breath  EXAM: PORTABLE CHEST - 1 VIEW  COMPARISON:  01/03/2013  FINDINGS: Stable heart size. Status post aortic valve replacement.  Enlarging right pleural effusion, now large in volume, with extensive underlying passive atelectasis. Patchy interstitial opacities at the left base are new from prior. No overt edema based on the left upper lobe vascularity. No pneumothorax.  Unchanged positioning of a right base pleural catheter.  IMPRESSION: 1. Increased right pleural effusion, now large volume. A right pleural catheter is in stable position. 2. Small new opacity in the left lower lung. If there is clinical concern for infection, this could represent an early infectious infiltrate.   Electronically Signed   By: Tiburcio Pea   On: 01/07/2013 23:28    Review of Systems  Constitutional: Positive for malaise/fatigue. Negative for fever and chills.  HENT: Negative for congestion.   Eyes: Negative for photophobia.  Respiratory: Positive for shortness of breath. Negative for cough, sputum production and wheezing.    Cardiovascular: Negative  for chest pain, leg swelling and PND.  Gastrointestinal: Negative for heartburn, nausea, vomiting and abdominal pain.  Neurological: Positive for weakness. Negative for dizziness, loss of consciousness and headaches.  Psychiatric/Behavioral: Positive for depression.    Blood pressure 116/64, pulse 85, temperature 98.1 F (36.7 C), temperature source Oral, resp. rate 20, SpO2 99.00%. Physical Exam  Constitutional: He is oriented to person, place, and time. He appears well-developed and well-nourished. No distress.  tearful  HENT:  Head: Normocephalic and atraumatic.  Eyes: Conjunctivae and EOM are normal. Pupils are equal, round, and reactive to light.  Neck: Normal range of motion. Neck supple. No JVD present. No thyromegaly present.  Cardiovascular: Normal rate, regular rhythm, normal heart sounds and intact distal pulses.  Exam reveals no friction rub.   No murmur heard. Respiratory: Effort normal. He has decreased breath sounds in the right middle field and the right lower field. He has no wheezes. He has no rales.  GI: Soft. Bowel sounds are normal. He exhibits no distension. There is no tenderness. There is no guarding.  Musculoskeletal: Normal range of motion.  Neurological: He is alert and oriented to person, place, and time. No cranial nerve deficit.  Skin: Skin is warm. He is not diaphoretic.  Psychiatric: He exhibits a depressed mood.     Assessment/Plan 79 yr old WM w/ pmhx significant for mesothelioma s/p Pleurx catheter placement by Dr. Tyrone Sage, CAD, CHF, depression, presents due to SOB, found to have larger right sided pleural effusion and possibly HCAP. 1) Right sided malignant pleural effusion: Currently, he is not hypoxic. He is hemodynamically stable. I have discussed the admission with[facility] on call tonight, he has been followed by Dr. Tyrone Sage. I have held pradaxa due to planned procedure. 2) Possible HCAP: Will  treat with Vanc and Cefepime for now. BCx2. He has an allergy to PCN. 3) CAD: Hemodynamically stable. Continue home meds. 4) Depression: I will continue his home medication, but a psychiatry consultation may be helpful for this patient tomorrow. 5) FEN: NPO except meds for now. 6) Proph: SCDs for now given planned procedure. 7) Code: FULL  Jonah Blue, DO, FACP 01/08/2013, 1:42 AM

## 2013-01-08 NOTE — Progress Notes (Signed)
Utilization Review Completed.[name] T9/22/2014

## 2013-01-08 NOTE — Progress Notes (Signed)
ANTIBIOTIC CONSULT NOTE - INITIAL  Pharmacy Consult for Vancomycin and Cefepime Indication: rule out pneumonia  Allergies  Allergen Reactions  . Penicillins Other (See Comments)    Pt doesn't remember the reaction  . Warfarin And Related Other (See Comments)    Pt gets the shakes    Patient Measurements: Weight: 164 lb (74.39 kg)  Vital Signs: Temp: 98.5 F (36.9 C) (09/22 0227) Temp src: Oral (09/22 0227) BP: 127/74 mmHg (09/22 0227) Pulse Rate: 86 (09/22 0227) Intake/Output from previous day:   Intake/Output from this shift:    Labs:  Recent Labs  01/07/13 2211  WBC 13.1*  HGB 12.3*  PLT 555*  CREATININE 1.39*   The CrCl is unknown because both a height and weight (above a minimum accepted value) are required for this calculation. No results found for this basename: VANCOTROUGH, VANCOPEAK, VANCORANDOM, GENTTROUGH, GENTPEAK, GENTRANDOM, TOBRATROUGH, TOBRAPEAK, TOBRARND, AMIKACINPEAK, AMIKACINTROU, AMIKACIN,  in the last 72 hours   Microbiology: No results found for this or any previous visit (from the past 720 hour(s)).  Medical History: Past Medical History  Diagnosis Date  . Hypothyroidism     takes Levothyroxine  . Coronary artery disease     valve replaced  . Sleep apnea 2011    does not use CPAP  . Seizures     cannot remember last seizure  . Arthritis     all over body  . Anxiety     comes and goes  . Depression     comes and goes  . Dysrhythmia     recurrent arrhythmia w/AV replacement  . CHF (congestive heart failure) 02/10/11 note    has compensated CHF per Dr. Katrinka Blazing note  . Constipation 08/2009    after surgery  . Urinary tract bacterial infections 08/2010    from surgery and catheters    Medications:  Prescriptions prior to admission  Medication Sig Dispense Refill  . amiodarone (PACERONE) 200 MG tablet Take 100 mg by mouth daily.       Marland Kitchen atorvastatin (LIPITOR) 10 MG tablet Take 10 mg by mouth every morning.      . calcium-vitamin  D (OSCAL WITH D) 500-200 MG-UNIT per tablet Take 1 tablet by mouth daily.      . clonazePAM (KLONOPIN) 0.5 MG tablet Take 0.5 mg by mouth daily.       . dabigatran (PRADAXA) 150 MG CAPS Take 150 mg by mouth every 12 (twelve) hours.       Marland Kitchen levothyroxine (SYNTHROID, LEVOTHROID) 50 MCG tablet Take 50 mcg by mouth every morning.       . metoprolol (TOPROL-XL) 50 MG 24 hr tablet Take 25 mg by mouth daily.       . Misc Natural Products (OSTEO BI-FLEX ADV DOUBLE ST PO) Take 1 tablet by mouth daily.       . phenytoin (DILANTIN) 100 MG ER capsule Take 200 mg by mouth 2 (two) times daily.      . polyethylene glycol (MIRALAX / GLYCOLAX) packet Take 8.5 g by mouth daily.       Marland Kitchen rOPINIRole (REQUIP) 0.5 MG tablet Take 0.5 mg by mouth every 8 (eight) hours.       . sertraline (ZOLOFT) 50 MG tablet Take 100 mg by mouth 2 (two) times daily.      . Tetrahydrozoline HCl (VISINE OP) Place 1 drop into both eyes daily.      . vitamin C (ASCORBIC ACID) 500 MG tablet Take 500 mg by mouth daily.  Assessment: 79 yo male with pleural effusion , possible PNA, for empiric antibiotics.  Vancomycin 1250 mg IV given in ED at 0300  Goal of Therapy:  Vancomycin trough level 15-20 mcg/ml  Plan:  Vancomycin 750 mg IV q24h Cefepime 2 g IV q12h  Chae, [place] 01/08/2013,3:24 AM

## 2013-01-09 ENCOUNTER — Inpatient Hospital Stay (HOSPITAL_COMMUNITY): Payer: Medicare Other

## 2013-01-09 ENCOUNTER — Encounter (HOSPITAL_COMMUNITY): Payer: Self-pay | Admitting: Anesthesiology

## 2013-01-09 ENCOUNTER — Inpatient Hospital Stay (HOSPITAL_COMMUNITY): Admission: RE | Admit: 2013-01-09 | Payer: Medicare Other | Source: Ambulatory Visit | Admitting: Cardiothoracic Surgery

## 2013-01-09 ENCOUNTER — Other Ambulatory Visit: Payer: Self-pay | Admitting: *Deleted

## 2013-01-09 ENCOUNTER — Inpatient Hospital Stay (HOSPITAL_COMMUNITY): Payer: Medicare Other | Admitting: Certified Registered"

## 2013-01-09 ENCOUNTER — Encounter (HOSPITAL_COMMUNITY): Payer: Self-pay | Admitting: Certified Registered"

## 2013-01-09 ENCOUNTER — Encounter (HOSPITAL_COMMUNITY)
Admission: EM | Disposition: A | Discharge status: Home / Self Care | Payer: Self-pay | Source: Home / Self Care | Attending: Cardiothoracic Surgery

## 2013-01-09 DIAGNOSIS — J910 Malignant pleural effusion: Secondary | ICD-10-CM

## 2013-01-09 HISTORY — PX: VIDEO ASSISTED THORACOSCOPY: SHX5073

## 2013-01-09 HISTORY — PX: TALC PLEURODESIS: SHX2506

## 2013-01-09 HISTORY — PX: PLEURAL BIOPSY: SHX5082

## 2013-01-09 HISTORY — PX: PLEURAL EFFUSION DRAINAGE: SHX5099

## 2013-01-09 HISTORY — PX: VIDEO BRONCHOSCOPY: SHX5072

## 2013-01-09 HISTORY — PX: CHEST TUBE INSERTION: SHX231

## 2013-01-09 SURGERY — Surgical Case
Anesthesia: *Unknown

## 2013-01-09 SURGERY — BRONCHOSCOPY, VIDEO-ASSISTED
Anesthesia: General | Site: Chest | Laterality: Right | Wound class: Clean Contaminated

## 2013-01-09 MED ORDER — SODIUM CHLORIDE 0.9 % IJ SOLN: 9.0000 mL | INTRAMUSCULAR | Status: DC | PRN

## 2013-01-09 MED ORDER — ACETAMINOPHEN 160 MG/5ML PO SOLN: 1000.0000 mg | Freq: Four times a day (QID) | ORAL | Status: AC

## 2013-01-09 MED ORDER — NALOXONE HCL 0.4 MG/ML IJ SOLN
0.4000 mg | INTRAMUSCULAR | Status: DC | PRN
  Filled 2013-01-09: qty 1

## 2013-01-09 MED ORDER — ARTIFICIAL TEARS OP OINT
TOPICAL_OINTMENT | OPHTHALMIC | Status: DC | PRN
  Administered 2013-01-09: 1 via OPHTHALMIC

## 2013-01-09 MED ORDER — LEVALBUTEROL HCL 0.63 MG/3ML IN NEBU
0.6300 mg | INHALATION_SOLUTION | Freq: Four times a day (QID) | RESPIRATORY_TRACT | Status: DC
  Administered 2013-01-09 – 2013-01-10 (×3): 0.63 mg via RESPIRATORY_TRACT
  Filled 2013-01-09 (×8): qty 3

## 2013-01-09 MED ORDER — NEOSTIGMINE METHYLSULFATE 1 MG/ML IJ SOLN
INTRAMUSCULAR | Status: DC | PRN
  Administered 2013-01-09: 1 mg via INTRAVENOUS
  Administered 2013-01-09: 4 mg via INTRAVENOUS

## 2013-01-09 MED ORDER — ONDANSETRON HCL 4 MG/2ML IJ SOLN
4.0000 mg | Freq: Four times a day (QID) | INTRAMUSCULAR | Status: DC | PRN
  Administered 2013-01-09 – 2013-01-12 (×2): 4 mg via INTRAVENOUS
  Filled 2013-01-09 (×3): qty 2

## 2013-01-09 MED ORDER — 0.9 % SODIUM CHLORIDE (POUR BTL) OPTIME
TOPICAL | Status: DC | PRN
  Administered 2013-01-09: 1000 mL

## 2013-01-09 MED ORDER — SENNOSIDES-DOCUSATE SODIUM 8.6-50 MG PO TABS
1.0000 | ORAL_TABLET | Freq: Every evening | ORAL | Status: DC | PRN
  Filled 2013-01-09: qty 1

## 2013-01-09 MED ORDER — DOCUSATE SODIUM 100 MG PO CAPS
100.0000 mg | ORAL_CAPSULE | Freq: Two times a day (BID) | ORAL | Status: DC
  Administered 2013-01-09 – 2013-01-12 (×7): 100 mg via ORAL
  Filled 2013-01-09 (×8): qty 1

## 2013-01-09 MED ORDER — ONDANSETRON HCL 4 MG/2ML IJ SOLN: 4.0000 mg | Freq: Four times a day (QID) | INTRAMUSCULAR | Status: DC | PRN

## 2013-01-09 MED ORDER — DIPHENHYDRAMINE HCL 50 MG/ML IJ SOLN
12.5000 mg | Freq: Four times a day (QID) | INTRAMUSCULAR | Status: DC | PRN
  Administered 2013-01-10: 12.5 mg via INTRAVENOUS
  Filled 2013-01-09: qty 1

## 2013-01-09 MED ORDER — FENTANYL CITRATE 0.05 MG/ML IJ SOLN
INTRAMUSCULAR | Status: DC | PRN
  Administered 2013-01-09: 100 ug via INTRAVENOUS
  Administered 2013-01-09 (×2): 50 ug via INTRAVENOUS
  Administered 2013-01-09: 100 ug via INTRAVENOUS
  Administered 2013-01-09: 50 ug via INTRAVENOUS

## 2013-01-09 MED ORDER — OXYCODONE-ACETAMINOPHEN 5-325 MG PO TABS
1.0000 | ORAL_TABLET | ORAL | Status: DC | PRN
  Administered 2013-01-11 (×2): 2 via ORAL
  Administered 2013-01-11: 1 via ORAL
  Administered 2013-01-12 (×3): 2 via ORAL
  Administered 2013-01-12: 1 via ORAL
  Administered 2013-01-13: 2 via ORAL
  Filled 2013-01-09: qty 1
  Filled 2013-01-09 (×2): qty 2
  Filled 2013-01-09 (×2): qty 1
  Filled 2013-01-09 (×4): qty 2

## 2013-01-09 MED ORDER — ONDANSETRON HCL 4 MG/2ML IJ SOLN
INTRAMUSCULAR | Status: DC | PRN
  Administered 2013-01-09: 4 mg via INTRAVENOUS

## 2013-01-09 MED ORDER — TALC 5 G PL SUSR
INTRAPLEURAL | Status: DC | PRN
  Administered 2013-01-09: 1 g via INTRAPLEURAL

## 2013-01-09 MED ORDER — PHENYLEPHRINE HCL 10 MG/ML IJ SOLN
INTRAMUSCULAR | Status: DC | PRN
  Administered 2013-01-09: 120 ug via INTRAVENOUS
  Administered 2013-01-09: 80 ug via INTRAVENOUS
  Administered 2013-01-09 (×3): 120 ug via INTRAVENOUS
  Administered 2013-01-09 (×2): 80 ug via INTRAVENOUS

## 2013-01-09 MED ORDER — ACETAMINOPHEN 500 MG PO TABS
1000.0000 mg | ORAL_TABLET | Freq: Four times a day (QID) | ORAL | Status: AC
  Administered 2013-01-09 (×2): 1000 mg via ORAL
  Filled 2013-01-09 (×2): qty 2

## 2013-01-09 MED ORDER — ROCURONIUM BROMIDE 100 MG/10ML IV SOLN
INTRAVENOUS | Status: DC | PRN
  Administered 2013-01-09: 10 mg via INTRAVENOUS
  Administered 2013-01-09: 20 mg via INTRAVENOUS
  Administered 2013-01-09: 40 mg via INTRAVENOUS
  Administered 2013-01-09: 30 mg via INTRAVENOUS

## 2013-01-09 MED ORDER — VECURONIUM BROMIDE 10 MG IV SOLR
INTRAVENOUS | Status: DC | PRN
  Administered 2013-01-09: 2 mg via INTRAVENOUS
  Administered 2013-01-09: 1 mg via INTRAVENOUS

## 2013-01-09 MED ORDER — GLYCOPYRROLATE 0.2 MG/ML IJ SOLN
INTRAMUSCULAR | Status: DC | PRN
  Administered 2013-01-09: 0.6 mg via INTRAVENOUS
  Administered 2013-01-09: 0.2 mg via INTRAVENOUS

## 2013-01-09 MED ORDER — FENTANYL 10 MCG/ML IV SOLN
INTRAVENOUS | Status: DC
  Administered 2013-01-09: 110 ug via INTRAVENOUS
  Administered 2013-01-09: 11:00:00 via INTRAVENOUS
  Administered 2013-01-09: 120 ug via INTRAVENOUS
  Administered 2013-01-09: 30 ug via INTRAVENOUS
  Administered 2013-01-10: 80 ug via INTRAVENOUS
  Administered 2013-01-10: 40 ug via INTRAVENOUS
  Administered 2013-01-10: 30 ug via INTRAVENOUS
  Administered 2013-01-10: 10:00:00 via INTRAVENOUS
  Administered 2013-01-10: 20 ug via INTRAVENOUS
  Administered 2013-01-10: 130 ug via INTRAVENOUS
  Administered 2013-01-11: 50 ug via INTRAVENOUS
  Administered 2013-01-11: 70 ug via INTRAVENOUS
  Filled 2013-01-09 (×3): qty 50

## 2013-01-09 MED ORDER — DEXTROSE-NACL 5-0.45 % IV SOLN
INTRAVENOUS | Status: DC
  Administered 2013-01-09: 14:00:00 via INTRAVENOUS
  Administered 2013-01-11: 100 mL/h via INTRAVENOUS

## 2013-01-09 MED ORDER — HYDROMORPHONE HCL PF 1 MG/ML IJ SOLN
0.2500 mg | INTRAMUSCULAR | Status: DC | PRN
  Administered 2013-01-09: 0.25 mg via INTRAVENOUS

## 2013-01-09 MED ORDER — PROPOFOL 10 MG/ML IV BOLUS
INTRAVENOUS | Status: DC | PRN
  Administered 2013-01-09: 140 mg via INTRAVENOUS

## 2013-01-09 MED ORDER — BISACODYL 5 MG PO TBEC
10.0000 mg | DELAYED_RELEASE_TABLET | Freq: Every day | ORAL | Status: DC
  Administered 2013-01-09 – 2013-01-12 (×3): 10 mg via ORAL
  Filled 2013-01-09 (×2): qty 2

## 2013-01-09 MED ORDER — LIDOCAINE HCL (CARDIAC) 20 MG/ML IV SOLN
INTRAVENOUS | Status: DC | PRN
  Administered 2013-01-09: 70 mg via INTRAVENOUS

## 2013-01-09 MED ORDER — POTASSIUM CHLORIDE 10 MEQ/50ML IV SOLN
10.0000 meq | Freq: Every day | INTRAVENOUS | Status: DC | PRN
  Filled 2013-01-09: qty 50

## 2013-01-09 MED ORDER — DIPHENHYDRAMINE HCL 12.5 MG/5ML PO ELIX
12.5000 mg | ORAL_SOLUTION | Freq: Four times a day (QID) | ORAL | Status: DC | PRN
  Filled 2013-01-09: qty 5

## 2013-01-09 MED ORDER — HYDROMORPHONE HCL PF 1 MG/ML IJ SOLN
INTRAMUSCULAR | Status: AC
  Filled 2013-01-09: qty 1

## 2013-01-09 MED ORDER — EPHEDRINE SULFATE 50 MG/ML IJ SOLN
INTRAMUSCULAR | Status: DC | PRN
  Administered 2013-01-09 (×2): 10 mg via INTRAVENOUS
  Administered 2013-01-09: 5 mg via INTRAVENOUS
  Administered 2013-01-09: 10 mg via INTRAVENOUS
  Administered 2013-01-09: 5 mg via INTRAVENOUS

## 2013-01-09 MED ORDER — VANCOMYCIN HCL IN DEXTROSE 1-5 GM/200ML-% IV SOLN
1000.0000 mg | Freq: Two times a day (BID) | INTRAVENOUS | Status: AC
  Administered 2013-01-09: 1000 mg via INTRAVENOUS
  Filled 2013-01-09: qty 200

## 2013-01-09 MED ORDER — ONDANSETRON HCL 4 MG/2ML IJ SOLN: 4.0000 mg | Freq: Once | INTRAMUSCULAR | Status: DC | PRN

## 2013-01-09 MED ORDER — OXYCODONE HCL 5 MG PO TABS
5.0000 mg | ORAL_TABLET | ORAL | Status: AC | PRN
  Administered 2013-01-09 – 2013-01-10 (×4): 5 mg via ORAL
  Filled 2013-01-09: qty 2
  Filled 2013-01-09 (×2): qty 1

## 2013-01-09 MED ORDER — LACTATED RINGERS IV SOLN
INTRAVENOUS | Status: DC | PRN
  Administered 2013-01-09 (×2): via INTRAVENOUS

## 2013-01-09 SURGICAL SUPPLY — 86 items
ADH SKN CLS APL DERMABOND .7 (GAUZE/BANDAGES/DRESSINGS) ×3
APL SRG 22X2 LUM MLBL SLNT (VASCULAR PRODUCTS)
APL SRG 7X2 LUM MLBL SLNT (VASCULAR PRODUCTS)
APPLICATOR TIP COSEAL (VASCULAR PRODUCTS) IMPLANT
APPLICATOR TIP EXT COSEAL (VASCULAR PRODUCTS) IMPLANT
BLADE SURG 11 STRL SS (BLADE) ×1 IMPLANT
BRUSH CYTOL CELLEBRITY 1.5X140 (MISCELLANEOUS) IMPLANT
BRUSH SCRUB EZ PLAIN DRY (MISCELLANEOUS) ×8 IMPLANT
CANISTER SUCTION 2500CC (MISCELLANEOUS) ×5 IMPLANT
CATH KIT ON Q 5IN SLV (PAIN MANAGEMENT) IMPLANT
CATH ROBINSON RED A/P 18FR (CATHETERS) ×1 IMPLANT
CATH THORACIC 28FR (CATHETERS) ×1 IMPLANT
CATH THORACIC 36FR (CATHETERS) IMPLANT
CATH THORACIC 36FR RT ANG (CATHETERS) IMPLANT
CLIP TI MEDIUM 6 (CLIP) IMPLANT
CONT SPEC 4OZ CLIKSEAL STRL BL (MISCELLANEOUS) ×8 IMPLANT
COVER SURGICAL LIGHT HANDLE (MISCELLANEOUS) ×4 IMPLANT
COVER TABLE BACK 60X90 (DRAPES) ×4 IMPLANT
COVER TRANSDUCER ULTRASND GEL (DRAPE) ×3 IMPLANT
DERMABOND ADVANCED (GAUZE/BANDAGES/DRESSINGS) ×1
DERMABOND ADVANCED .7 DNX12 (GAUZE/BANDAGES/DRESSINGS) ×3 IMPLANT
DRAPE C-ARM 42X72 X-RAY (DRAPES) ×3 IMPLANT
DRAPE LAPAROSCOPIC ABDOMINAL (DRAPES) ×4 IMPLANT
DRAPE SLUSH/WARMER DISC (DRAPES) ×1 IMPLANT
DRAPE WARM FLUID 44X44 (DRAPE) ×3 IMPLANT
DRILL BIT 7/64X5 (BIT) IMPLANT
DRSG AQUACEL AG ADV 3.5X14 (GAUZE/BANDAGES/DRESSINGS) ×4 IMPLANT
ELECT BLADE 4.0 EZ CLEAN MEGAD (MISCELLANEOUS) ×4
ELECT REM PT RETURN 9FT ADLT (ELECTROSURGICAL) ×4
ELECTRODE BLDE 4.0 EZ CLN MEGD (MISCELLANEOUS) ×3 IMPLANT
ELECTRODE REM PT RTRN 9FT ADLT (ELECTROSURGICAL) ×3 IMPLANT
FORCEPS BIOP RJ4 1.8 (CUTTING FORCEPS) IMPLANT
GLOVE BIO SURGEON STRL SZ 6 (GLOVE) ×4 IMPLANT
GLOVE BIO SURGEON STRL SZ 6.5 (GLOVE) ×8 IMPLANT
GLOVE BIOGEL PI IND STRL 7.0 (GLOVE) IMPLANT
GLOVE BIOGEL PI INDICATOR 7.0 (GLOVE) ×4
GOWN STRL NON-REIN LRG LVL3 (GOWN DISPOSABLE) ×13 IMPLANT
KIT BASIN OR (CUSTOM PROCEDURE TRAY) ×4 IMPLANT
KIT PLEURX DRAIN CATH 1000ML (MISCELLANEOUS) ×4 IMPLANT
KIT PLEURX DRAIN CATH 15.5FR (DRAIN) ×4 IMPLANT
KIT ROOM TURNOVER OR (KITS) ×4 IMPLANT
KIT SUCTION CATH 14FR (SUCTIONS) ×4 IMPLANT
MARKER SKIN DUAL TIP RULER LAB (MISCELLANEOUS) ×4 IMPLANT
NDL BIOPSY TRANSBRONCH 21G (NEEDLE) IMPLANT
NEEDLE BIOPSY TRANSBRONCH 21G (NEEDLE) IMPLANT
NS IRRIG 1000ML POUR BTL (IV SOLUTION) ×9 IMPLANT
OIL SILICONE PENTAX (PARTS (SERVICE/REPAIRS)) ×3 IMPLANT
PACK CHEST (CUSTOM PROCEDURE TRAY) ×4 IMPLANT
PACK GENERAL/GYN (CUSTOM PROCEDURE TRAY) ×4 IMPLANT
PAD ARMBOARD 7.5X6 YLW CONV (MISCELLANEOUS) ×8 IMPLANT
SEALANT PROGEL (MISCELLANEOUS) IMPLANT
SEALANT SURG COSEAL 4ML (VASCULAR PRODUCTS) IMPLANT
SEALANT SURG COSEAL 8ML (VASCULAR PRODUCTS) IMPLANT
SET DRAINAGE LINE (MISCELLANEOUS) IMPLANT
SOLUTION ANTI FOG 6CC (MISCELLANEOUS) ×4 IMPLANT
SPONGE GAUZE 4X4 12PLY (GAUZE/BANDAGES/DRESSINGS) ×5 IMPLANT
SUT ETHILON 3 0 FSL (SUTURE) ×4 IMPLANT
SUT PROLENE 3 0 SH DA (SUTURE) IMPLANT
SUT PROLENE 4 0 RB 1 (SUTURE)
SUT PROLENE 4-0 RB1 .5 CRCL 36 (SUTURE) IMPLANT
SUT SILK  1 MH (SUTURE) ×3
SUT SILK 1 MH (SUTURE) ×6 IMPLANT
SUT SILK 2 0SH CR/8 30 (SUTURE) ×1 IMPLANT
SUT SILK 3 0SH CR/8 30 (SUTURE) IMPLANT
SUT VIC AB 1 CTX 18 (SUTURE) IMPLANT
SUT VIC AB 1 CTX 36 (SUTURE)
SUT VIC AB 1 CTX36XBRD ANBCTR (SUTURE) IMPLANT
SUT VIC AB 2-0 CTX 36 (SUTURE) IMPLANT
SUT VIC AB 3-0 SH 27 (SUTURE) ×4
SUT VIC AB 3-0 SH 27X BRD (SUTURE) IMPLANT
SUT VIC AB 3-0 X1 27 (SUTURE) ×4 IMPLANT
SUT VICRYL 2 TP 1 (SUTURE) IMPLANT
SWAB COLLECTION DEVICE MRSA (MISCELLANEOUS) IMPLANT
SYR 20ML ECCENTRIC (SYRINGE) ×4 IMPLANT
SYR BULB IRRIGATION 50ML (SYRINGE) ×1 IMPLANT
SYSTEM SAHARA CHEST DRAIN ATS (WOUND CARE) ×4 IMPLANT
TAPE CLOTH 4X10 WHT NS (GAUZE/BANDAGES/DRESSINGS) ×4 IMPLANT
TAPE CLOTH SURG 4X10 WHT LF (GAUZE/BANDAGES/DRESSINGS) ×1 IMPLANT
TIP APPLICATOR SPRAY EXTEND 16 (VASCULAR PRODUCTS) IMPLANT
TOWEL OR 17X24 6PK STRL BLUE (TOWEL DISPOSABLE) ×4 IMPLANT
TOWEL OR 17X26 10 PK STRL BLUE (TOWEL DISPOSABLE) ×8 IMPLANT
TRAP SPECIMEN MUCOUS 40CC (MISCELLANEOUS) ×5 IMPLANT
TRAY FOLEY CATH 14FRSI W/METER (CATHETERS) ×4 IMPLANT
TUBE ANAEROBIC SPECIMEN COL (MISCELLANEOUS) IMPLANT
TUBE CONNECTING 12X1/4 (SUCTIONS) ×8 IMPLANT
WATER STERILE IRR 1000ML POUR (IV SOLUTION) ×8 IMPLANT

## 2013-01-09 NOTE — Brief Op Note (Signed)
      [address]       [place] [address]             [phone]   01/09/2013 9:41 AM  PATIENT:  [name]  79 y.o. male  PRE-OPERATIVE DIAGNOSIS:  malignant  Right pleural effusion  POST-OPERATIVE DIAGNOSIS: same  PROCEDURE:  Procedure(s) with comments:  VIDEO BRONCHOSCOPY (N/A) VIDEO ASSISTED THORACOSCOPY (Right) DRAINAGE OF PLEURAL EFFUSION (Right) INSERTION PLEURAL DRAINAGE CATHETER (Right) PLEURAL BIOPSY (N/A) TALC PLEURADESIS (Right) Removal of old pleural drainage cath  SURGEON:  Surgeon(s) and Role:    * Delight Ovens, MD - Primary  PHYSICIAN ASSISTANT: Erin Barrett PA-C  ANESTHESIA:   general  EBL:  Total I/O In: 1000 [I.V.:1000] Out: 100 [Urine:100] 2l bloody pleural fluid was drained  BLOOD ADMINISTERED:none  DRAINS: Pleur-x catheter right chest, 28 Straight chest tube right chest ,foley  LOCAL MEDICATIONS USED:  NONE  SPECIMEN:  Source of Specimen:  Right Pleural Fluid, Right Pleural Tissue  DISPOSITION OF SPECIMEN:  Cytology, Microbiology, Pathology  COUNTS:  YES   DICTATION: .Dragon Dictation  PLAN OF CARE: Admit to inpatient   PATIENT DISPOSITION:  PACU - hemodynamically stable.   Delay start of Pharmacological VTE agent (>24hrs) due to surgical blood loss or risk of bleeding: yes

## 2013-01-09 NOTE — Anesthesia Procedure Notes (Addendum)
Procedure Name: Intubation Date/Time: 01/09/2013 7:42 AM Performed by: De Nurse Pre-anesthesia Checklist: Patient identified, Emergency Drugs available, Suction available, Patient being monitored and Timeout performed Patient Re-evaluated:Patient Re-evaluated prior to inductionOxygen Delivery Method: Circle system utilized Preoxygenation: Pre-oxygenation with 100% oxygen Intubation Type: IV induction Ventilation: Mask ventilation without difficulty Laryngoscope Size: Mac and 4 Grade View: Grade I Tube size: 8.5 mm Number of attempts: 1 Airway Equipment and Method: Stylet Placement Confirmation: ETT inserted through vocal cords under direct vision,  positive ETCO2 and breath sounds checked- equal and bilateral Secured at: 23 cm Tube secured with: Tape Dental Injury: Teeth and Oropharynx as per pre-operative assessment    Procedure Name: Intubation Date/Time: 01/09/2013 7:56 AM Performed by: Jefm Miles E Laryngoscope Size: Mac and 4 Grade View: Grade I Tube type: Oral Endobronchial tube: Double lumen EBT and Left and 37 Fr Number of attempts: 1 Airway Equipment and Method: Fiberoptic brochoscope Placement Confirmation: ETT inserted through vocal cords under direct vision,  positive ETCO2 and breath sounds checked- equal and bilateral Secured at: 29 cm Tube secured with: Tape Dental Injury: Teeth and Oropharynx as per pre-operative assessment

## 2013-01-09 NOTE — Progress Notes (Signed)
Report given to david rn as caregiver 

## 2013-01-09 NOTE — Anesthesia Preprocedure Evaluation (Addendum)
Anesthesia Evaluation  Patient identified by MRN, date of birth, ID band Patient awake    Reviewed: Allergy & Precautions, H&P , NPO status , Patient's Chart, lab work & pertinent test results  Airway Mallampati: I TM Distance: >3 FB Neck ROM: full    Dental  (+) Teeth Intact, Chipped and Dental Advisory Given   Pulmonary sleep apnea , pneumonia -, former smoker,          Cardiovascular + CAD and +CHF + dysrhythmias Rhythm:regular Rate:Normal     Neuro/Psych Seizures -,  PSYCHIATRIC DISORDERS Anxiety Depression    GI/Hepatic   Endo/Other  Hypothyroidism   Renal/GU      Musculoskeletal   Abdominal   Peds  Hematology   Anesthesia Other Findings   Reproductive/Obstetrics                          Anesthesia Physical Anesthesia Plan  ASA: III  Anesthesia Plan: General   Post-op Pain Management:    Induction: Intravenous  Airway Management Planned: Oral ETT  Additional Equipment: Arterial line and CVP  Intra-op Plan:   Post-operative Plan: Possible Post-op intubation/ventilation  Informed Consent: I have reviewed the patients History and Physical, chart, labs and discussed the procedure including the risks, benefits and alternatives for the proposed anesthesia with the patient or authorized representative who has indicated his/her understanding and acceptance.     Plan Discussed with: CRNA, Anesthesiologist and Surgeon  Anesthesia Plan Comments:         Anesthesia Quick Evaluation

## 2013-01-09 NOTE — Anesthesia Postprocedure Evaluation (Signed)
  Anesthesia Post-op Note  Patient: [name]  Procedure(s) Performed: Procedure(s) with comments: VIDEO BRONCHOSCOPY (N/A) VIDEO ASSISTED THORACOSCOPY (Right) DRAINAGE OF PLEURAL EFFUSION (Right) - AND PLEURODESIS INSERTION PLEURAL DRAINAGE CATHETER (Right) PLEURAL BIOPSY (N/A) TALC PLEURADESIS (Right)  Patient Location: PACU  Anesthesia Type:General  Level of Consciousness: awake, alert , oriented and patient cooperative  Airway and Oxygen Therapy: Patient Spontanous Breathing  Post-op Pain: mild  Post-op Assessment: Post-op Vital signs reviewed, Patient's Cardiovascular Status Stable, Respiratory Function Stable, Patent Airway, No signs of Nausea or vomiting and Pain level controlled  Post-op Vital Signs: stable  Complications: No apparent anesthesia complications

## 2013-01-09 NOTE — Transfer of Care (Signed)
Immediate Anesthesia Transfer of Care Note  Patient: [name]  Procedure(s) Performed: Procedure(s) with comments: VIDEO BRONCHOSCOPY (N/A) VIDEO ASSISTED THORACOSCOPY (Right) DRAINAGE OF PLEURAL EFFUSION (Right) - AND PLEURODESIS INSERTION PLEURAL DRAINAGE CATHETER (Right) PLEURAL BIOPSY (N/A) TALC PLEURADESIS (Right)  Patient Location: PACU  Anesthesia Type:General  Level of Consciousness: awake, alert  and oriented  Airway & Oxygen Therapy: Patient Spontanous Breathing and Patient connected to nasal cannula oxygen  Post-op Assessment: Report given to PACU RN  Post vital signs: Reviewed and stable  Complications: No apparent anesthesia complications

## 2013-01-09 NOTE — Progress Notes (Signed)
Patient ID: [name], male   DOB: Oct 02, 1927, 79 y.o.   MRN: [id number]  SICU Evening Rounds:  Hemodynamically stable  Chest tube output low.  No new problems

## 2013-01-09 NOTE — Preoperative (Signed)
Beta Blockers   Reason not to administer Beta Blockers:Not Applicable 

## 2013-01-10 ENCOUNTER — Inpatient Hospital Stay (HOSPITAL_COMMUNITY): Payer: Medicare Other

## 2013-01-10 LAB — CBC
Hemoglobin: 11.2 g/dL — ABNORMAL LOW (ref 13.0–17.0)
MCH: 32.1 pg (ref 26.0–34.0)
Platelets: 546 10*3/uL — ABNORMAL HIGH (ref 150–400)
RBC: 3.49 MIL/uL — ABNORMAL LOW (ref 4.22–5.81)
RDW: 13.4 % (ref 11.5–15.5)
WBC: 18.9 10*3/uL — ABNORMAL HIGH (ref 4.0–10.5)

## 2013-01-10 LAB — BASIC METABOLIC PANEL
CO2: 23 mEq/L (ref 19–32)
Chloride: 100 mEq/L (ref 96–112)
GFR calc Af Amer: 54 mL/min — ABNORMAL LOW (ref >90)
Potassium: 4.9 mEq/L (ref 3.5–5.1)
Sodium: 134 mEq/L — ABNORMAL LOW (ref 135–145)

## 2013-01-10 LAB — POCT I-STAT 3, ART BLOOD GAS (G3+)
Bicarbonate: 21.3 mEq/L (ref 20.0–24.0)
Patient temperature: 99.3
pCO2 arterial: 38.3 mmHg (ref 35.0–45.0)
pH, Arterial: 7.355 (ref 7.350–7.450)
pO2, Arterial: 80 mmHg (ref 80.0–100.0)

## 2013-01-10 MED ORDER — LEVALBUTEROL HCL 0.63 MG/3ML IN NEBU
0.6300 mg | INHALATION_SOLUTION | Freq: Three times a day (TID) | RESPIRATORY_TRACT | Status: DC
  Administered 2013-01-10 – 2013-01-12 (×3): 0.63 mg via RESPIRATORY_TRACT
  Filled 2013-01-10 (×9): qty 3

## 2013-01-10 MED ORDER — BISACODYL 10 MG RE SUPP
10.0000 mg | Freq: Every day | RECTAL | Status: DC | PRN
  Administered 2013-01-10 – 2013-01-12 (×2): 10 mg via RECTAL
  Filled 2013-01-10 (×3): qty 1

## 2013-01-10 MED ORDER — ENOXAPARIN SODIUM 30 MG/0.3ML ~~LOC~~ SOLN
30.0000 mg | SUBCUTANEOUS | Status: DC
  Administered 2013-01-10 – 2013-01-11 (×2): 30 mg via SUBCUTANEOUS
  Filled 2013-01-10 (×3): qty 0.3

## 2013-01-10 NOTE — Progress Notes (Signed)
Patient ID: [name], male   DOB: April 01, 1928, 79 y.o.   MRN: [id number] [facility] DAILY ICU PROGRESS NOTE                   [address]            [place] [address]          [phone]   1 Day Post-Op Procedure(s) (LRB): VIDEO BRONCHOSCOPY (N/A) VIDEO ASSISTED THORACOSCOPY (Right) DRAINAGE OF PLEURAL EFFUSION (Right) INSERTION PLEURAL DRAINAGE CATHETER (Right) PLEURAL BIOPSY (N/A) TALC PLEURADESIS (Right)  Total Length of Stay:  LOS: 3 days   Subjective: Alert and neuro intact, some rt chest pain from chest tube  Objective: Vital signs in last 24 hours: Temp:  [96.1 F (35.6 C)-99.3 F (37.4 C)] 97.8 F (36.6 C) (09/24 0759) Pulse Rate:  [78-110] 88 (09/24 0700) Cardiac Rhythm:  [-] Normal sinus rhythm (09/23 2000) Resp:  [13-31] 17 (09/24 0700) BP: (83-126)/(42-72) 83/46 mmHg (09/24 0700) SpO2:  [78 %-99 %] 95 % (09/24 0732) Arterial Line BP: (69-156)/(46-72) 102/66 mmHg (09/23 1900) FiO2 (%):  [99 %] 99 % (09/23 1600)  Filed Weights   01/08/13 0227  Weight: 164 lb (74.39 kg)    Weight change:    Hemodynamic parameters for last 24 hours:    Intake/Output from previous day: 09/23 0701 - 09/24 0700 In: 4263 [P.O.:360; I.V.:3703; IV Piggyback:200] Out: 1460 [Urine:1020; Chest Tube:440]  Intake/Output this shift:    Current Meds: Scheduled Meds: . acetaminophen  1,000 mg Oral Q6H   Or  . acetaminophen (TYLENOL) oral liquid 160 mg/5 mL  1,000 mg Oral Q6H  . amiodarone  100 mg Oral Daily  . bisacodyl  10 mg Oral Daily  . clonazePAM  0.5 mg Oral Daily  . docusate sodium  100 mg Oral BID  . enoxaparin (LOVENOX) injection  30 mg Subcutaneous Q24H  . fentaNYL   Intravenous Q4H  . influenza vac split quadrivalent PF  0.5 mL Intramuscular Tomorrow-1000  . levalbuterol  0.63 mg Nebulization Q6H  . levothyroxine  50 mcg Oral QAC breakfast  . metoprolol succinate  25 mg Oral Daily  . phenytoin  200 mg Oral BID  . polyethylene glycol  8.5 g  Oral Daily  . rOPINIRole  0.5 mg Oral Q8H  . sertraline  100 mg Oral BID  . tetrahydrozoline  1 drop Both Eyes BID   Continuous Infusions: . dextrose 5 % and 0.45% NaCl 100 mL/hr at 01/10/13 0700   PRN Meds:.diphenhydrAMINE, diphenhydrAMINE, naloxone, ondansetron (ZOFRAN) IV, oxyCODONE, oxyCODONE-acetaminophen, potassium chloride, senna-docusate, sodium chloride  General appearance: alert, cooperative and mild distress Neurologic: intact Heart: regular rate and rhythm, S1, S2 normal, no murmur, click, rub or gallop Lungs: diminished breath sounds bibasilar Abdomen: soft, non-tender; bowel sounds normal; no masses,  no organomegaly Extremities: not edema or evidence of dvt Wound: no air leak from ct  Lab Results: CBC: Recent Labs  01/08/13 0625 01/10/13 0330  WBC 10.1 18.9*  HGB 10.6* 11.2*  HCT 31.3* 33.0*  PLT 430* 546*   BMET:  Recent Labs  01/08/13 0625 01/10/13 0330  NA 136 134*  K 4.5 4.9  CL 104 100  CO2 21 23  GLUCOSE 97 143*  BUN 20 16  CREATININE 1.28 1.37*  CALCIUM 8.3* 7.8*    PT/INR:  Recent Labs  01/08/13 0625  LABPROT 20.3*  INR 1.79*   Radiology: Dg Chest 2 View  01/08/2013   CLINICAL DATA:  For fusion  EXAM: CHEST  2 VIEW  COMPARISON:  January 07, 2013  FINDINGS: There is a large right pleural effusion with consolidation of right mid and lung base unchanged. The left lung is clear. The mediastinal contour and cardiac silhouette are stable. Cardiac valvular replacement ring is unchanged. The patient is status post fixation of lumbar spine. Scoliosis of spine is noted.  IMPRESSION: Persistent large right pleural effusion with consolidation of right mid and lung base.   Electronically Signed   By: Sherian Rein   On: 01/08/2013 14:00   Dg Chest Port 1 View  01/10/2013   CLINICAL DATA:  Post VAT S on the right, follow-up  EXAM: PORTABLE CHEST - 1 VIEW  COMPARISON:  Portable chest x-ray of 01/09/2013  FINDINGS: There is little change in poor aeration  with basilar atelectasis and effusions right greater than left. A right chest tube is unchanged and right IJ central venous line tip overlies the lower SVC. Cardiomegaly is stable and there may be pulmonary vascular congestion present.  IMPRESSION: No significant change in basilar atelectasis and effusions with probable congestion.   Electronically Signed   By: Dwyane Dee M.D.   On: 01/10/2013 07:17   Dg Chest Portable 1 View  01/09/2013   CLINICAL DATA:  Post VATS, line placement  EXAM: PORTABLE CHEST - 1 VIEW  COMPARISON:  01/08/2013  FINDINGS: Again noted is status post median sternotomy and cardiac valve replacement. Central mild vascular congestion and mild perihilar interstitial prominence suspicious for mild interstitial edema. Previous right pleural effusion has resolved. There is a right chest tube with tip in right upper hemithorax. Small right lower lateral pneumothorax. There is thickening of right lower lobe pleura and mild thickening of right minor fissure. Residual atelectasis or infiltrate is noted in right lower lobe and right middle lobe. Right IJ central line with tip in SVC right atrium junction.  IMPRESSION: Central mild vascular congestion and mild perihilar interstitial prominence suspicious for mild interstitial edema. Previous right pleural effusion has resolved. There is a right chest tube with tip in right upper hemithorax. Small right lower lateral pneumothorax. There is thickening of right lower lobe pleura and mild thickening of right minor fissure. Residual atelectasis or infiltrate is noted in right lower lobe and right middle lobe. Right IJ central line with tip in SVC right atrium junction. .   Electronically Signed   By: Natasha Mead   On: 01/09/2013 11:28     Assessment/Plan: S/P Procedure(s) (LRB): VIDEO BRONCHOSCOPY (N/A) VIDEO ASSISTED THORACOSCOPY (Right) DRAINAGE OF PLEURAL EFFUSION (Right) INSERTION PLEURAL DRAINAGE CATHETER (Right) PLEURAL BIOPSY (N/A) TALC  PLEURADESIS (Right) Mobilize d/c tubes/lines Plan for transfer to step-down: see transfer orders D/c foley, bp borderline to give Flomax Path pending Start daily drainage of pleurix     Marlie Kuennen B 01/10/2013 8:07 AM

## 2013-01-10 NOTE — Progress Notes (Signed)
Patient now in SICU under Dr. Dennie Maizes care.   I note that there were some difficulties with pain control. That seems better and will be addressed by Dr. Tyrone Sage. Hemodynamically stable.   He states that his depressed mood seems better in the last two days. He had been started on sertraline a couple of weeks ago and it was just increased late last week. Perhaps it is beginning to take hold. His biggest concern is his wife's pain from lumbar radiculopathy that was not helped by a recent surgery. I was to see her in office Monday but she did not come in as (1) Mr. Tiso was in hospital and (2) her pain was too severe that morning. I promised Mr. Tomas that I would try to address all of this with her.

## 2013-01-10 NOTE — Progress Notes (Signed)
Patient ID: [name], male   DOB: [birth year], 79 y.o.   MRN: [id number] EVENING ROUNDS NOTE :     [address]       [place] [address]             [phone]                 1 Day Post-Op Procedure(s) (LRB): VIDEO BRONCHOSCOPY (N/A) VIDEO ASSISTED THORACOSCOPY (Right) DRAINAGE OF PLEURAL EFFUSION (Right) INSERTION PLEURAL DRAINAGE CATHETER (Right) PLEURAL BIOPSY (N/A) TALC PLEURADESIS (Right)  Total Length of Stay:  LOS: 3 days  BP 109/39  Pulse 86  Temp(Src) 97.8 F (36.6 C) (Oral)  Resp 16  Ht 5\' 6"  (1.676 m)  Wt 164 lb (74.39 kg)  BMI 26.48 kg/m2  SpO2 98%  .Intake/Output     09/23 0701 - 09/24 0700 09/24 0701 - 09/25 0700   P.O. 360    I.V. (mL/kg) 3703 (49.8) 847 (11.4)   IV Piggyback 200    Total Intake(mL/kg) 4263 (57.3) 847 (11.4)   Urine (mL/kg/hr) 1020 (0.6) 60 (0.1)   Stool     Chest Tube 440 (0.2) 20 (0)   Total Output 1460 80   Net +2803 +767          . dextrose 5 % and 0.45% NaCl 100 mL/hr at 01/10/13 0700     Lab Results  Component Value Date   WBC 18.9* 01/10/2013   HGB 11.2* 01/10/2013   HCT 33.0* 01/10/2013   PLT 546* 01/10/2013   GLUCOSE 143* 01/10/2013   ALT 20 01/08/2013   AST 22 01/08/2013   NA 134* 01/10/2013   K 4.9 01/10/2013   CL 100 01/10/2013   CREATININE 1.37* 01/10/2013   BUN 16 01/10/2013   CO2 23 01/10/2013   TSH 7.497* 06/17/2010   INR 1.79* 01/08/2013   Voiding On dvt proph with scd and lovenox No bm yet Pain better with chest tube out  Delight Ovens MD  Beeper [phone] Office [phone] 01/10/2013 5:30 PM

## 2013-01-10 NOTE — Progress Notes (Signed)
Patient's level of pain has been from a 5 to 10 thus far through the shift, recently at 0130 his pain was 10/10, having trouble breathing, crying, moaning, buckling over in pain.  Pain medication was given, patient was repositioned into another chair.  Patient's pain is being managed as can be with current orders, should his pain persist, will contact OC MD.   Patient does have and uses fentanyl reduced dose PCA when he is awake.

## 2013-01-10 NOTE — Op Note (Signed)
[name], [name] NO.:  [id number]  MEDICAL RECORD NO.:  [id number]  LOCATION:  2S10C                        FACILITY:  [facility]  PHYSICIAN:  Sheliah Plane, MD    DATE OF BIRTH:  09-Jul-1927  DATE OF PROCEDURE:  01/09/2013 DATE OF DISCHARGE:                              OPERATIVE REPORT   PREOPERATIVE DIAGNOSIS:  Malignant right pleural effusion, recurrent.  POSTOPERATIVE DIAGNOSIS:  Malignant right pleural effusion, recurrent.  SURGICAL PROCEDURE:  Bronchoscopy, right video-assisted thoracoscopy, drainage of pleural effusion, pleural biopsy, talc pleurodesis, placement of thorax pleural drainage catheter and removal of nonfunctioning PleurX catheter.  SURGEON:  Sheliah Plane, MD  FIRST ASSISTANT:  Pauline Good, PA  BRIEF HISTORY:  The patient is an 79 year old male, who, in January of 2014, was diagnosed with malignant pleural effusion.  At that time, Interventional Radiology placed a Bard pleural drainage catheter.  The patient continued to drain the right pleural fluid on a consistent basis when I saw him in late May.  At that time, because of the persistent drainage, a VATS and talc pleurodesis was recommended to the patient. However, he did not wish to have any interventional procedure done. Continue to follow him with continued drainage over the summer.  The drainage did decrease some, but was still every 3 days approximately 500 mL.  Attempt to the talc pleurodesis with slurry through the PleurX catheter was not effective, and his catheter became nonfunctional, and he began reaccumulating pleural fluid.  Followup CT scan was performed, and again, right video-assisted thoracoscopy with talc pleurodesis and replacement of the pleural drainage tube was recommended to the patient who agreed and signed informed consent.  DESCRIPTION OF PROCEDURE:  The patient underwent general endotracheal anesthesia without incident.  Appropriate time-out was  performed.  Video bronchoscope through a single-lumen endotracheal tube was done to the subsegmental level on both in the right and the left tracheobronchial tree.  There was no endobronchial lesions appreciated.  The scope was removed.  The double-lumen endotracheal tube was placed.  The patient was turned in the lateral decubitus position with the right side up.  A single port site was made approximately the posterior axillary line, 5th intercostal space.  Initially, a 30-degree scope was introduced into the chest.  There were a few loculations evident, which were drained and 2 L of blood-stained pleural fluid were removed.  Portions were sent to cytology and for culture.  Prior to the patient's previously placed PleurX catheter had been removed before the VATS procedure.  We then, under direct vision with the video scope, explored the pleural space. There were multiple pleural nodules on the parietal surface.  Using an operative scope, several biopsies of this tissue were obtained and sent to Pathology.  5 g of sterile talc was then insufflated into the right pleural space.  There appeared to be good diffuse coating throughout the space.  Under direct vision, a guidewire over a 16-gauge needle was introduced into the right pleural space, and the wire was left in place. A counterincision was made anteriorly, and the PleurX catheter was tunneled between the 2 serial dilators over the wire, and a peel-away sheaths were positioned  in the PleurX catheter placed through the chest wall.  With the scope to the catheter was positioned in the right chest space.  The PleurX catheter was secured in place.  Incision was closed. The single port site was then used as a chest tube site, and a 28 straight pleural chest tube was left in place.  The lung was reinflated. The patient was then awakened in the operating room and extubated having tolerated the procedure without obvious complication.  Blood  loss was minimal.  Sponge and needle count was reported as correct at the completion of the procedure.  The patient tolerated the procedure without obvious complication and was transferred to the recovery room for further postoperative care.     Sheliah Plane, MD     EG/MEDQ  D:  01/10/2013  T:  01/10/2013  Job:  [id number]

## 2013-01-11 ENCOUNTER — Inpatient Hospital Stay (HOSPITAL_COMMUNITY): Payer: Medicare Other

## 2013-01-11 ENCOUNTER — Ambulatory Visit: Payer: Medicare Other | Admitting: Oncology

## 2013-01-11 ENCOUNTER — Encounter (HOSPITAL_COMMUNITY): Payer: Self-pay | Admitting: Cardiothoracic Surgery

## 2013-01-11 LAB — CBC
HCT: 29.8 % — ABNORMAL LOW (ref 39.0–52.0)
Hemoglobin: 10 g/dL — ABNORMAL LOW (ref 13.0–17.0)
MCHC: 33.6 g/dL (ref 30.0–36.0)
MCV: 93.4 fL (ref 78.0–100.0)
Platelets: 436 10*3/uL — ABNORMAL HIGH (ref 150–400)
RDW: 13.3 % (ref 11.5–15.5)
WBC: 15 10*3/uL — ABNORMAL HIGH (ref 4.0–10.5)

## 2013-01-11 LAB — COMPREHENSIVE METABOLIC PANEL
Albumin: 1.8 g/dL — ABNORMAL LOW (ref 3.5–5.2)
Alkaline Phosphatase: 90 U/L (ref 39–117)
BUN: 15 mg/dL (ref 6–23)
Calcium: 7.7 mg/dL — ABNORMAL LOW (ref 8.4–10.5)
Chloride: 100 mEq/L (ref 96–112)
Creatinine, Ser: 1.15 mg/dL (ref 0.50–1.35)
GFR calc Af Amer: 66 mL/min — ABNORMAL LOW (ref >90)
GFR calc non Af Amer: 57 mL/min — ABNORMAL LOW (ref >90)
Glucose, Bld: 160 mg/dL — ABNORMAL HIGH (ref 70–99)
Total Bilirubin: 0.1 mg/dL — ABNORMAL LOW (ref 0.3–1.2)
Total Protein: 5.6 g/dL — ABNORMAL LOW (ref 6.0–8.3)

## 2013-01-11 MED ORDER — SERTRALINE HCL 100 MG PO TABS
100.0000 mg | ORAL_TABLET | Freq: Two times a day (BID) | ORAL | Status: DC
  Administered 2013-01-11 – 2013-01-13 (×5): 100 mg via ORAL
  Filled 2013-01-11 (×6): qty 1

## 2013-01-11 MED ORDER — DEXTROSE-NACL 5-0.45 % IV SOLN
INTRAVENOUS | Status: DC
  Administered 2013-01-11: 08:00:00 via INTRAVENOUS

## 2013-01-11 NOTE — Progress Notes (Addendum)
20 mL of Fentanyl PCA syringe wasted in sink, witnessed by Catalina Lunger, RN.  Catalina Lunger, RN

## 2013-01-11 NOTE — Progress Notes (Signed)
He is recovering from the VATS procedure. I discussed the pathology report with [name].  Hopefully the talc pleurodesis will control the right pleural effusion.  He will continue postop care with Drs. Tyrone Sage and Earl Gala.  Please call me as needed. I will schedule outpatient followup.

## 2013-01-11 NOTE — Progress Notes (Signed)
Per report RN from 2300 drained pleurex catheter and pt ambulated 3 times on unit. Pt is stable and resting now on 2W.

## 2013-01-11 NOTE — Progress Notes (Signed)
UR Completed.  Tiarah Shisler Jane [phone] 01/11/2013  

## 2013-01-11 NOTE — Progress Notes (Addendum)
Patient ID: [name], male   DOB: February 19, 1928, 79 y.o.   MRN: [id number] [facility] DAILY ICU PROGRESS NOTE                   [address]            [place] [address]          [phone]   2 Days Post-Op Procedure(s) (LRB): VIDEO BRONCHOSCOPY (N/A) VIDEO ASSISTED THORACOSCOPY (Right) DRAINAGE OF PLEURAL EFFUSION (Right) INSERTION PLEURAL DRAINAGE CATHETER (Right) PLEURAL BIOPSY (N/A) TALC PLEURADESIS (Right)  Total Length of Stay:  LOS: 4 days   Subjective: Feels better today, ambulating   Objective: Vital signs in last 24 hours: Temp:  [97.5 F (36.4 C)-98.9 F (37.2 C)] 97.6 F (36.4 C) (09/25 0739) Pulse Rate:  [82-101] 93 (09/25 0615) Cardiac Rhythm:  [-] Normal sinus rhythm (09/25 0400) Resp:  [12-28] 28 (09/25 0700) BP: (80-118)/(37-66) 118/64 mmHg (09/25 0600) SpO2:  [91 %-99 %] 94 % (09/25 0615)  Filed Weights   01/08/13 0227  Weight: 164 lb (74.39 kg)    Weight change:    Hemodynamic parameters for last 24 hours:    Intake/Output from previous day: 09/24 0701 - 09/25 0700 In: 2825 [P.O.:360; I.V.:2465] Out: 705 [Urine:685; Chest Tube:20]  Intake/Output this shift:    Current Meds: Scheduled Meds: . amiodarone  100 mg Oral Daily  . bisacodyl  10 mg Oral Daily  . clonazePAM  0.5 mg Oral Daily  . docusate sodium  100 mg Oral BID  . enoxaparin (LOVENOX) injection  30 mg Subcutaneous Q24H  . fentaNYL   Intravenous Q4H  . levalbuterol  0.63 mg Nebulization TID  . levothyroxine  50 mcg Oral QAC breakfast  . metoprolol succinate  25 mg Oral Daily  . phenytoin  200 mg Oral BID  . polyethylene glycol  8.5 g Oral Daily  . rOPINIRole  0.5 mg Oral Q8H  . sertraline  100 mg Oral BID  . tetrahydrozoline  1 drop Both Eyes BID   Continuous Infusions: . dextrose 5 % and 0.45% NaCl 100 mL/hr (01/11/13 0615)   PRN Meds:.bisacodyl, diphenhydrAMINE, diphenhydrAMINE, naloxone, ondansetron (ZOFRAN) IV, oxyCODONE-acetaminophen, potassium  chloride, senna-docusate, sodium chloride  General appearance: alert, cooperative, appears stated age and no distress Neurologic: intact Heart: regular rate and rhythm, S1, S2 normal, no murmur, click, rub or gallop Lungs: diminished breath sounds bibasilar Abdomen: soft, non-tender; bowel sounds normal; no masses,  no organomegaly Extremities: extremities normal, atraumatic, no cyanosis or edema and Homans sign is negative, no sign of DVT Wound: chest tube out   Lab Results: CBC: Recent Labs  01/10/13 0330 01/11/13 0420  WBC 18.9* 15.0*  HGB 11.2* 10.0*  HCT 33.0* 29.8*  PLT 546* 436*   BMET:  Recent Labs  01/10/13 0330 01/11/13 0420  NA 134* 132*  K 4.9 4.4  CL 100 100  CO2 23 22  GLUCOSE 143* 160*  BUN 16 15  CREATININE 1.37* 1.15  CALCIUM 7.8* 7.7*    PT/INR: No results found for this basename: LABPROT, INR,  in the last 72 hours Radiology: Dg Chest Port 1 View  01/11/2013   *RADIOLOGY REPORT*  Clinical Data: Postop check, chest tube removed  PORTABLE CHEST - 1 VIEW  Comparison: Prior chest x-ray 01/10/2013  Findings: Interval removal of the right-sided thoracostomy tube. The right tunneled pleural drainage catheter remains in place.  No evidence of pneumothorax.  Stable pleural thickening which is not unexpected status post,  pleura the cyst.  Persistent right lower lobe and right middle lobe atelectasis.  Stable cardiomegaly. Right IJ approach central venous catheter with the tip in the mid SVC.  Overall, minimally changed appearance of the chest. Persistent small left pleural effusion and associated left basilar atelectasis.  IMPRESSION:  1.  No evidence of pneumothorax following removal of right thoracostomy tube.  The right tunneled pleural drain remains in unchanged position. 2.  Persistent right greater than left basilar atelectasis versus infiltrate and right greater than left residual pleural fluid versus pleural thickening.   Original Report Authenticated By: Malachy Moan, M.D.   Dg Chest Port 1 View  01/10/2013   CLINICAL DATA:  Post VAT S on the right, follow-up  EXAM: PORTABLE CHEST - 1 VIEW  COMPARISON:  Portable chest x-ray of 01/09/2013  FINDINGS: There is little change in poor aeration with basilar atelectasis and effusions right greater than left. A right chest tube is unchanged and right IJ central venous line tip overlies the lower SVC. Cardiomegaly is stable and there may be pulmonary vascular congestion present.  IMPRESSION: No significant change in basilar atelectasis and effusions with probable congestion.   Electronically Signed   By: Dwyane Dee M.D.   On: 01/10/2013 07:17   Dg Chest Portable 1 View  01/09/2013   CLINICAL DATA:  Post VATS, line placement  EXAM: PORTABLE CHEST - 1 VIEW  COMPARISON:  01/08/2013  FINDINGS: Again noted is status post median sternotomy and cardiac valve replacement. Central mild vascular congestion and mild perihilar interstitial prominence suspicious for mild interstitial edema. Previous right pleural effusion has resolved. There is a right chest tube with tip in right upper hemithorax. Small right lower lateral pneumothorax. There is thickening of right lower lobe pleura and mild thickening of right minor fissure. Residual atelectasis or infiltrate is noted in right lower lobe and right middle lobe. Right IJ central line with tip in SVC right atrium junction.  IMPRESSION: Central mild vascular congestion and mild perihilar interstitial prominence suspicious for mild interstitial edema. Previous right pleural effusion has resolved. There is a right chest tube with tip in right upper hemithorax. Small right lower lateral pneumothorax. There is thickening of right lower lobe pleura and mild thickening of right minor fissure. Residual atelectasis or infiltrate is noted in right lower lobe and right middle lobe. Right IJ central line with tip in SVC right atrium junction. .   Electronically Signed   By: Natasha Mead   On:  01/09/2013 11:28     Assessment/Plan: S/P Procedure(s) (LRB): VIDEO BRONCHOSCOPY (N/A) VIDEO ASSISTED THORACOSCOPY (Right) DRAINAGE OF PLEURAL EFFUSION (Right) INSERTION PLEURAL DRAINAGE CATHETER (Right) PLEURAL BIOPSY (N/A) TALC PLEURADESIS (Right) Mobilize Plan for transfer to step-down: see transfer orders drain pleurix daily  On lovenox low dose now will resume Pradaxa before d/c home Remains in sinus  Cristiano Capri B 01/11/2013 8:09 AM

## 2013-01-11 NOTE — Progress Notes (Signed)
Spoke with [name] and he has been able to void and had a small BM. Still a lot of chest pain with cough (no surprise there). He seems to be making good progress.   I note that his bx confirms mesothelioma.   I saw his wife in the office yesterday and have a treatment plan for her that I am optimistic will help her pain. I want his current care team to be aware of that since we all understand that a lot of his mood issues relate to her pain.   I am going to be out of town for a week after tomorrow morning. I would appreciate it if Dr. Tyrone Sage would manage [name] care until discharge. Of course, if one of my colleagues need to assist, they are available at [phone]

## 2013-01-12 ENCOUNTER — Inpatient Hospital Stay (HOSPITAL_COMMUNITY): Payer: Medicare Other

## 2013-01-12 LAB — BODY FLUID CULTURE: Culture: NO GROWTH

## 2013-01-12 MED ORDER — DABIGATRAN ETEXILATE MESYLATE 150 MG PO CAPS
150.0000 mg | ORAL_CAPSULE | Freq: Two times a day (BID) | ORAL | Status: DC
  Administered 2013-01-12 – 2013-01-13 (×3): 150 mg via ORAL
  Filled 2013-01-12 (×5): qty 1

## 2013-01-12 MED ORDER — LEVALBUTEROL HCL 0.63 MG/3ML IN NEBU
0.6300 mg | INHALATION_SOLUTION | Freq: Two times a day (BID) | RESPIRATORY_TRACT | Status: DC
  Administered 2013-01-12 – 2013-01-13 (×2): 0.63 mg via RESPIRATORY_TRACT
  Filled 2013-01-12 (×3): qty 3

## 2013-01-12 MED ORDER — LEVALBUTEROL HCL 0.63 MG/3ML IN NEBU: 0.6300 mg | INHALATION_SOLUTION | Freq: Four times a day (QID) | RESPIRATORY_TRACT | Status: DC | PRN

## 2013-01-12 NOTE — Discharge Summary (Signed)
Physician Discharge Summary  Patient ID: [name] MRN: [id number] DOB/AGE: 77-Apr-1929 77 y.o.  Admit date: 01/07/2013 Discharge date: 01/12/2013  Admission Diagnoses:  Patient Active Problem List   Diagnosis Date Noted  . Mesothelioma 01/08/2013  . Major depressive disorder, single episode, severe, without mention of psychotic behavior 01/08/2013  . Pleural effusion 05/30/2012  . Atelectasis 05/30/2012   Discharge Diagnoses:   Patient Active Problem List   Diagnosis Date Noted  . Mesothelioma 01/08/2013  . Major depressive disorder, single episode, severe, without mention of psychotic behavior 01/08/2013  . Pleural effusion 05/30/2012  . Atelectasis 05/30/2012   Discharged Condition: good  History of Present Illness:   [name] is an 79 yo white male well known to [facility] with diagnosis of Mesothelioma.  At time of initial diagnosis patient declined all intervention.  He later developed a recurrent right sided pleural effusion and in January of this year he underwent a right sided Pleur-x catheter placed by [facility].  The patient was originally referred to Dr. Tyrone Sage in May of this year for further management of his Pleur-x catheter.  At that time the patient was offered VATS procedure, talc pleurodesis, or talc slurry placement into the current catheter.  The patient refused all of treatment options stating he did not want any hospitalization or major interventions.  Therefore, it was recommended the patient increase the frequency of draining his Pleur-x.  This was increased to every 3 days from 5.  The patient continued to do this for the next 3 weeks.  He again followed up with Dr. Tyrone Sage at which time it was noted to have increased the amount of pleural drainage.  The patient again refused all intervention and wished to continue draining he Pleur-x catheter.  This continued to for several weeks.  The patient's output eventually decreased at which time  he was willing to undergo bedside Talc Pleurodesis.  This was performed on 12/25/2012 at which time 350 cc of blood tinged output was removed and 3g of sterile talc was placed into the Pleur-x catheter.  The procedure was well tolerated.  He presented for follow up on 01/05/2013 at which time his Pleur-x catheter was no longer draining fluid.  However, he continued to have a right sided pleural effusion which was felt to be loculated.  CT scan of the chest was obtained and revealed multiloculated complex pleural effusions with progression of disease.  It was felt the patient should undergo Right VATS with removal of old Pleur-x, drainage of effusion, talc pleurodesis and placement of new Pleur-x catheter.  The risks and benefits of the procedure were explained to the patient and he was agreeable to proceed.  However, prior to scheduled surgery patient presented to the Emergency Department with a 3-4 day history of worsening dyspnea.  The patient also was exhibiting severe depressive symptoms.  Workup in the ED led to admission from the medicine service with concern for pneumonia.      Hospital Course:  Upon admission to the hospital the patient was placed on IV ABX and blood cultures were obtained.  Dr. Tyrone Sage evaluated the patient and felt that his symptoms were due to his pleural effusions and malignancy.  Therefore, surgery would proceed as scheduled.  The patient was taken to the operating room on 01/09/2013.  He underwent Right VATS with drainage of loculated pleural effusion, talc pleurodesis, pleural biopsy, and insertion of new Pleur-x drainage catheter.  The patient tolerated the procedure well, was extubated and  taken to the PACU in stable condition.  The patient remained afebrile with no leukocytosis and his antibiotics for suspected pneumonia were discontinued.  The patient has done well post operatively.  He has some increased pain with relief with PCA.  His chest tube drainage has been low and his  chest tube was removed without difficulty.  His Pleur-x catheter will remain in place and is currently draining 50 cc output daily.  The patient has been taken off his PCA and his pain is well controlled with oral medication.  Home health care has been arranged for the patient.  He is medically stable at this time.  Pending patient feels up to being discharge we anticipate this in the next 24-48 hours.  He will follow up with Dr. Tyrone Sage in 2 weeks with a CXR.  He will follow up with PCP and Oncologist as scheduled.       Significant Diagnostic Studies: Pathology  Pleura, biopsy, Right pleural - CONSISTENT WITH MALIGNANT MESOTHELIOMA, SEE COMMENT.  Treatments: surgery:  Bronchoscopy, right video-assisted thoracoscopy,  drainage of pleural effusion, pleural biopsy, talc pleurodesis,  placement of thorax pleural drainage catheter and removal of  nonfunctioning PleurX catheter  Disposition: Home with Home Health  Discharge Medications    Medication List         amiodarone 200 MG tablet  Commonly known as:  PACERONE  Take 100 mg by mouth daily.     atorvastatin 10 MG tablet  Commonly known as:  LIPITOR  Take 10 mg by mouth every morning.     calcium-vitamin D 500-200 MG-UNIT per tablet  Commonly known as:  OSCAL WITH D  Take 1 tablet by mouth daily.     clonazePAM 0.5 MG tablet  Commonly known as:  KLONOPIN  Take 0.5 mg by mouth daily.     dabigatran 150 MG Caps capsule  Commonly known as:  PRADAXA  Take 150 mg by mouth every 12 (twelve) hours.     levothyroxine 50 MCG tablet  Commonly known as:  SYNTHROID, LEVOTHROID  Take 50 mcg by mouth every morning.     metoprolol succinate 50 MG 24 hr tablet  Commonly known as:  TOPROL-XL  Take 25 mg by mouth daily.     OSTEO BI-FLEX ADV DOUBLE ST PO  Take 1 tablet by mouth daily.     oxyCODONE-acetaminophen 5-325 MG per tablet  Commonly known as:  PERCOCET/ROXICET  Take 1-2 tablets by mouth every 4 (four) hours as needed.      phenytoin 100 MG ER capsule  Commonly known as:  DILANTIN  Take 200 mg by mouth 2 (two) times daily.     polyethylene glycol packet  Commonly known as:  MIRALAX / GLYCOLAX  Take 8.5 g by mouth daily.     rOPINIRole 0.5 MG tablet  Commonly known as:  REQUIP  Take 0.5 mg by mouth every 8 (eight) hours.     sertraline 50 MG tablet  Commonly known as:  ZOLOFT  Take 100 mg by mouth 2 (two) times daily.     VISINE OP  Place 1 drop into both eyes daily.     vitamin C 500 MG tablet  Commonly known as:  ASCORBIC ACID  Take 500 mg by mouth daily.           Future Appointments Provider Department Dept Phone   02/08/2013 3:30 PM Ladene Artist, MD [facility] MEDICAL ONCOLOGY [phone]   06/21/2013 10:15 AM Lyn Records  III, MD [facility] [address] [phone]      Signed: [name] 01/12/2013, 9:39 AM

## 2013-01-12 NOTE — Progress Notes (Signed)
Patient ID: [name], male   DOB: June 08, 1927, 79 y.o.   MRN: [id number] TCTS DAILY ICU PROGRESS NOTE                        [address]       [place] [address]             [phone]        3 Days Post-Op Procedure(s) (LRB): VIDEO BRONCHOSCOPY (N/A) VIDEO ASSISTED THORACOSCOPY (Right) DRAINAGE OF PLEURAL EFFUSION (Right) INSERTION PLEURAL DRAINAGE CATHETER (Right) PLEURAL BIOPSY (N/A) TALC PLEURADESIS (Right)  Total Length of Stay:  LOS: 5 days   Subjective: Feels better today, ambulated in hall with help, pain better control Objective: Vital signs in last 24 hours: Temp:  [97.6 F (36.4 C)-98.7 F (37.1 C)] 97.7 F (36.5 C) (09/26 0426) Pulse Rate:  [77-93] 82 (09/26 0426) Cardiac Rhythm:  [-] Heart block;Normal sinus rhythm (09/25 1950) Resp:  [15-26] 20 (09/26 0426) BP: (84-126)/(41-75) 126/75 mmHg (09/26 0426) SpO2:  [90 %-98 %] 91 % (09/26 0426)  Filed Weights   01/08/13 0227  Weight: 164 lb (74.39 kg)    Weight change:    Hemodynamic parameters for last 24 hours:    Intake/Output from previous day: 09/25 0701 - 09/26 0700 In: 815 [P.O.:675; I.V.:140] Out: 1175 [Urine:1125; Chest Tube:50]  Intake/Output this shift:    Current Meds: Scheduled Meds: . amiodarone  100 mg Oral Daily  . bisacodyl  10 mg Oral Daily  . clonazePAM  0.5 mg Oral Daily  . docusate sodium  100 mg Oral BID  . enoxaparin (LOVENOX) injection  30 mg Subcutaneous Q24H  . levalbuterol  0.63 mg Nebulization TID  . levothyroxine  50 mcg Oral QAC breakfast  . metoprolol succinate  25 mg Oral Daily  . phenytoin  200 mg Oral BID  . polyethylene glycol  8.5 g Oral Daily  . rOPINIRole  0.5 mg Oral Q8H  . sertraline  100 mg Oral BID  . tetrahydrozoline  1 drop Both Eyes BID   Continuous Infusions: . dextrose 5 % and 0.45% NaCl Stopped (01/11/13 1500)   PRN Meds:.bisacodyl, diphenhydrAMINE, diphenhydrAMINE, ondansetron (ZOFRAN) IV, oxyCODONE-acetaminophen,  potassium chloride, senna-docusate  General appearance: alert, cooperative, appears stated age and no distress Neurologic: intact Heart: regular rate and rhythm, S1, S2 normal, no murmur, click, rub or gallop Lungs: diminished breath sounds bibasilar Abdomen: soft, non-tender; bowel sounds normal; no masses,  no organomegaly Extremities: extremities normal, atraumatic, no cyanosis or edema and Homans sign is negative, no sign of DVT Wound: chest tube out   Lab Results: CBC:  Recent Labs  01/10/13 0330 01/11/13 0420  WBC 18.9* 15.0*  HGB 11.2* 10.0*  HCT 33.0* 29.8*  PLT 546* 436*   BMET:   Recent Labs  01/10/13 0330 01/11/13 0420  NA 134* 132*  K 4.9 4.4  CL 100 100  CO2 23 22  GLUCOSE 143* 160*  BUN 16 15  CREATININE 1.37* 1.15  CALCIUM 7.8* 7.7*    PT/INR: No results found for this basename: LABPROT, INR,  in the last 72 hours Radiology: Dg Chest 2 View  01/12/2013   CLINICAL DATA:  Status post right VATS, drainage of pleural effusion and talc pleurodesis.  EXAM: CHEST  2 VIEW  COMPARISON:  Single view of the chest 01/11/2013.  FINDINGS: Pleural drainage catheter on the right remains in place. No pneumothorax identified. Right IJ catheter is again noted. Aeration in  the right upper lobe has improved. Right middle and lower lobe atelectasis is unchanged. Trace left pleural effusion is noted. Heart size is upper normal.  IMPRESSION: Improved aeration in the right upper lobe. No change in pleural thickening on the right and atelectasis in the right middle and lower lobes.   Electronically Signed   By: Drusilla Kanner M.D.   On: 01/12/2013 06:08   Dg Chest Port 1 View  01/11/2013   *RADIOLOGY REPORT*  Clinical Data: Postop check, chest tube removed  PORTABLE CHEST - 1 VIEW  Comparison: Prior chest x-ray 01/10/2013  Findings: Interval removal of the right-sided thoracostomy tube. The right tunneled pleural drainage catheter remains in place.  No evidence of pneumothorax.   Stable pleural thickening which is not unexpected status post, pleura the cyst.  Persistent right lower lobe and right middle lobe atelectasis.  Stable cardiomegaly. Right IJ approach central venous catheter with the tip in the mid SVC.  Overall, minimally changed appearance of the chest. Persistent small left pleural effusion and associated left basilar atelectasis.  IMPRESSION:  1.  No evidence of pneumothorax following removal of right thoracostomy tube.  The right tunneled pleural drain remains in unchanged position. 2.  Persistent right greater than left basilar atelectasis versus infiltrate and right greater than left residual pleural fluid versus pleural thickening.   Original Report Authenticated By: Malachy Moan, M.D.     Assessment/Plan: S/P Procedure(s) (LRB): VIDEO BRONCHOSCOPY (N/A) VIDEO ASSISTED THORACOSCOPY (Right) DRAINAGE OF PLEURAL EFFUSION (Right) INSERTION PLEURAL DRAINAGE CATHETER (Right) PLEURAL BIOPSY (N/A) TALC PLEURADESIS (Right) Mobilize drain pleurix today if output remains low will d/c home with m,w,f drainage for one week if still low will d/c pleurix as outpatien  will resume Pradaxa ( on chronically for history of afib) And d/c lovenox Remains in sinus Home with home nurse to drain pleurix when stronger  [name] 01/12/2013 7:16 AM

## 2013-01-13 LAB — BASIC METABOLIC PANEL
BUN: 15 mg/dL (ref 6–23)
CO2: 24 mEq/L (ref 19–32)
Calcium: 8 mg/dL — ABNORMAL LOW (ref 8.4–10.5)
Chloride: 100 mEq/L (ref 96–112)
Creatinine, Ser: 1.18 mg/dL (ref 0.50–1.35)
GFR calc Af Amer: 64 mL/min — ABNORMAL LOW (ref >90)
GFR calc non Af Amer: 56 mL/min — ABNORMAL LOW (ref >90)
Glucose, Bld: 92 mg/dL (ref 70–99)
Potassium: 4.6 mEq/L (ref 3.5–5.1)
Sodium: 135 mEq/L (ref 135–145)

## 2013-01-13 LAB — CBC
HCT: 28.7 % — ABNORMAL LOW (ref 39.0–52.0)
Hemoglobin: 9.8 g/dL — ABNORMAL LOW (ref 13.0–17.0)
MCH: 31.4 pg (ref 26.0–34.0)
MCHC: 34.1 g/dL (ref 30.0–36.0)
MCV: 92 fL (ref 78.0–100.0)
Platelets: 454 10*3/uL — ABNORMAL HIGH (ref 150–400)
RBC: 3.12 MIL/uL — ABNORMAL LOW (ref 4.22–5.81)
RDW: 13.2 % (ref 11.5–15.5)
WBC: 11.1 10*3/uL — ABNORMAL HIGH (ref 4.0–10.5)

## 2013-01-13 MED ORDER — OXYCODONE-ACETAMINOPHEN 5-325 MG PO TABS: 1.0000 | ORAL_TABLET | ORAL | Status: AC | PRN

## 2013-01-13 MED ORDER — LACTULOSE 10 GM/15ML PO SOLN
30.0000 g | Freq: Every day | ORAL | Status: DC
  Administered 2013-01-13: 30 g via ORAL
  Filled 2013-01-13: qty 45

## 2013-01-13 NOTE — Progress Notes (Addendum)
      [address]       [place] [address]             [phone]      4 Days Post-Op Procedure(s) (LRB): VIDEO BRONCHOSCOPY (N/A) VIDEO ASSISTED THORACOSCOPY (Right) DRAINAGE OF PLEURAL EFFUSION (Right) INSERTION PLEURAL DRAINAGE CATHETER (Right) PLEURAL BIOPSY (N/A) TALC PLEURADESIS (Right)  Subjective:  [name] complains of not having a BM.  He states he does not want to be discharged until he has one.   Objective: Vital signs in last 24 hours: Temp:  [98.1 F (36.7 C)-98.7 F (37.1 C)] 98.7 F (37.1 C) (09/27 0531) Pulse Rate:  [76-85] 76 (09/27 0531) Cardiac Rhythm:  [-] Normal sinus rhythm (09/27 0700) Resp:  [18] 18 (09/27 0531) BP: (107-122)/(53-70) 107/53 mmHg (09/27 0531) SpO2:  [93 %-97 %] 94 % (09/27 0531) Weight:  [170 lb 3.2 oz (77.202 kg)] 170 lb 3.2 oz (77.202 kg) (09/27 0531)  Intake/Output from previous day: 09/26 0701 - 09/27 0700 In: 720 [P.O.:720] Out: 1635 [Urine:1600; Chest Tube:35] Intake/Output this shift: Total I/O In: -  Out: 250 [Urine:250]  General appearance: alert, cooperative and no distress Heart: regular rate and rhythm Lungs: clear to auscultation bilaterally Abdomen: soft, non-tender; bowel sounds normal; no masses,  no organomegaly Wound: clean and dry  Lab Results:  Recent Labs  01/11/13 0420 01/13/13 0450  WBC 15.0* 11.1*  HGB 10.0* 9.8*  HCT 29.8* 28.7*  PLT 436* 454*   BMET:  Recent Labs  01/11/13 0420 01/13/13 0450  NA 132* 135  K 4.4 4.6  CL 100 100  CO2 22 24  GLUCOSE 160* 92  BUN 15 15  CREATININE 1.15 1.18  CALCIUM 7.7* 8.0*    PT/INR: No results found for this basename: LABPROT, INR,  in the last 72 hours ABG    Component Value Date/Time   PHART 7.355 01/10/2013 0326   HCO3 21.3 01/10/2013 0326   TCO2 22 01/10/2013 0326   ACIDBASEDEF 4.0* 01/10/2013 0326   O2SAT 95.0 01/10/2013 0326   CBG (last 3)  No results found for this basename: GLUCAP,  in the last 72  hours  Assessment/Plan: S/P Procedure(s) (LRB): VIDEO BRONCHOSCOPY (N/A) VIDEO ASSISTED THORACOSCOPY (Right) DRAINAGE OF PLEURAL EFFUSION (Right) INSERTION PLEURAL DRAINAGE CATHETER (Right) PLEURAL BIOPSY (N/A) TALC PLEURADESIS (Right)  1. Pleur-x- in place, 35 cc drained yesterday, will continue daily drainage for now 2. LOC Constipation- will order Lactulose 3. CV- H/O A. Fib, NSR currently continue Pradaxa 4. Dispo- patient is stable work on [employer], if successful, hopefully home in AM with H/H   LOS: 6 days    [name], [name] 01/13/2013  patient examined and medical record reviewed,agree with above note. VAN TRIGT III,Cyriah Childrey 01/13/2013  Patient had BM and is safe for DC home today

## 2013-01-14 LAB — CULTURE, BLOOD (ROUTINE X 2): Culture: NO GROWTH

## 2013-01-14 NOTE — Progress Notes (Signed)
Discharged to home with family office visits in place teaching done  

## 2013-01-19 ENCOUNTER — Other Ambulatory Visit: Payer: Self-pay

## 2013-01-19 DIAGNOSIS — J90 Pleural effusion, not elsewhere classified: Secondary | ICD-10-CM

## 2013-01-20 ENCOUNTER — Other Ambulatory Visit: Payer: Self-pay | Admitting: Interventional Cardiology

## 2013-01-21 ENCOUNTER — Emergency Department (HOSPITAL_COMMUNITY)
Admission: EM | Admit: 2013-01-21 | Discharge: 2013-01-21 | Disposition: A | Discharge status: Home / Self Care | Payer: Medicare Other | Attending: Emergency Medicine | Admitting: Emergency Medicine

## 2013-01-21 ENCOUNTER — Encounter (HOSPITAL_COMMUNITY): Payer: Self-pay | Admitting: *Deleted

## 2013-01-21 DIAGNOSIS — R45851 Suicidal ideations: Secondary | ICD-10-CM | POA: Insufficient documentation

## 2013-01-21 DIAGNOSIS — Z79899 Other long term (current) drug therapy: Secondary | ICD-10-CM | POA: Insufficient documentation

## 2013-01-21 DIAGNOSIS — Z87891 Personal history of nicotine dependence: Secondary | ICD-10-CM | POA: Insufficient documentation

## 2013-01-21 DIAGNOSIS — G40909 Epilepsy, unspecified, not intractable, without status epilepticus: Secondary | ICD-10-CM | POA: Insufficient documentation

## 2013-01-21 DIAGNOSIS — Z87440 Personal history of urinary (tract) infections: Secondary | ICD-10-CM | POA: Insufficient documentation

## 2013-01-21 DIAGNOSIS — E039 Hypothyroidism, unspecified: Secondary | ICD-10-CM | POA: Insufficient documentation

## 2013-01-21 DIAGNOSIS — F329 Major depressive disorder, single episode, unspecified: Secondary | ICD-10-CM

## 2013-01-21 DIAGNOSIS — F32A Depression, unspecified: Secondary | ICD-10-CM

## 2013-01-21 DIAGNOSIS — I509 Heart failure, unspecified: Secondary | ICD-10-CM | POA: Insufficient documentation

## 2013-01-21 DIAGNOSIS — Z8719 Personal history of other diseases of the digestive system: Secondary | ICD-10-CM | POA: Insufficient documentation

## 2013-01-21 DIAGNOSIS — Z8739 Personal history of other diseases of the musculoskeletal system and connective tissue: Secondary | ICD-10-CM | POA: Insufficient documentation

## 2013-01-21 DIAGNOSIS — I2510 Atherosclerotic heart disease of native coronary artery without angina pectoris: Secondary | ICD-10-CM | POA: Insufficient documentation

## 2013-01-21 DIAGNOSIS — F3289 Other specified depressive episodes: Secondary | ICD-10-CM | POA: Insufficient documentation

## 2013-01-21 DIAGNOSIS — Z880 Allergy status to penicillin: Secondary | ICD-10-CM | POA: Insufficient documentation

## 2013-01-21 DIAGNOSIS — F411 Generalized anxiety disorder: Secondary | ICD-10-CM | POA: Insufficient documentation

## 2013-01-21 LAB — PHENYTOIN LEVEL, TOTAL: Phenytoin Lvl: <2.5 ug/mL — ABNORMAL LOW (ref 10.0–20.0)

## 2013-01-21 MED ORDER — LEVOTHYROXINE SODIUM 50 MCG PO TABS
50.0000 ug | ORAL_TABLET | ORAL | Status: DC
  Filled 2013-01-21: qty 1

## 2013-01-21 MED ORDER — METOPROLOL SUCCINATE ER 25 MG PO TB24
25.0000 mg | ORAL_TABLET | Freq: Every day | ORAL | Status: DC
  Administered 2013-01-21: 25 mg via ORAL
  Filled 2013-01-21: qty 1

## 2013-01-21 MED ORDER — ONDANSETRON HCL 4 MG PO TABS: 4.0000 mg | ORAL_TABLET | Freq: Three times a day (TID) | ORAL | Status: DC | PRN

## 2013-01-21 MED ORDER — AMIODARONE HCL 100 MG PO TABS
100.0000 mg | ORAL_TABLET | Freq: Every day | ORAL | Status: DC
  Administered 2013-01-21: 100 mg via ORAL
  Filled 2013-01-21: qty 1

## 2013-01-21 MED ORDER — ALUM & MAG HYDROXIDE-SIMETH 200-200-20 MG/5ML PO SUSP: 30.0000 mL | ORAL | Status: DC | PRN

## 2013-01-21 MED ORDER — CALCIUM CARBONATE-VITAMIN D 500-200 MG-UNIT PO TABS
1.0000 | ORAL_TABLET | Freq: Every day | ORAL | Status: DC
  Administered 2013-01-21: 21:00:00 1 via ORAL
  Filled 2013-01-21: qty 1

## 2013-01-21 MED ORDER — SERTRALINE HCL 50 MG PO TABS
100.0000 mg | ORAL_TABLET | Freq: Two times a day (BID) | ORAL | Status: DC
  Administered 2013-01-21: 100 mg via ORAL
  Filled 2013-01-21: qty 2

## 2013-01-21 MED ORDER — CLONAZEPAM 0.5 MG PO TABS
0.5000 mg | ORAL_TABLET | Freq: Every day | ORAL | Status: DC
  Administered 2013-01-21: 0.5 mg via ORAL
  Filled 2013-01-21: qty 1

## 2013-01-21 MED ORDER — POLYETHYLENE GLYCOL 3350 17 G PO PACK
8.5000 g | PACK | Freq: Every day | ORAL | Status: DC
  Filled 2013-01-21: qty 1

## 2013-01-21 MED ORDER — PHENYTOIN SODIUM EXTENDED 100 MG PO CAPS
400.0000 mg | ORAL_CAPSULE | Freq: Once | ORAL | Status: AC
  Administered 2013-01-21: 400 mg via ORAL
  Filled 2013-01-21: qty 4

## 2013-01-21 MED ORDER — OXYCODONE-ACETAMINOPHEN 5-325 MG PO TABS
1.0000 | ORAL_TABLET | ORAL | Status: DC | PRN
  Administered 2013-01-21: 1 via ORAL
  Filled 2013-01-21: qty 1

## 2013-01-21 MED ORDER — ROPINIROLE HCL 0.5 MG PO TABS
0.5000 mg | ORAL_TABLET | Freq: Three times a day (TID) | ORAL | Status: DC
  Administered 2013-01-21: 0.5 mg via ORAL
  Filled 2013-01-21 (×2): qty 1

## 2013-01-21 MED ORDER — LORAZEPAM 1 MG PO TABS: 1.0000 mg | ORAL_TABLET | Freq: Three times a day (TID) | ORAL | Status: DC | PRN

## 2013-01-21 MED ORDER — ATORVASTATIN CALCIUM 10 MG PO TABS: 10.0000 mg | ORAL_TABLET | Freq: Every morning | ORAL | Status: DC

## 2013-01-21 MED ORDER — CLONAZEPAM 0.5 MG PO TABS: 0.5000 mg | ORAL_TABLET | Freq: Every day | ORAL | Status: DC

## 2013-01-21 MED ORDER — PHENYTOIN SODIUM EXTENDED 100 MG PO CAPS
200.0000 mg | ORAL_CAPSULE | Freq: Two times a day (BID) | ORAL | Status: DC
  Administered 2013-01-21: 200 mg via ORAL
  Filled 2013-01-21: qty 2

## 2013-01-21 MED ORDER — VITAMIN C 500 MG PO TABS
500.0000 mg | ORAL_TABLET | Freq: Every day | ORAL | Status: DC
  Administered 2013-01-21: 500 mg via ORAL
  Filled 2013-01-21: qty 1

## 2013-01-21 MED ORDER — ACETAMINOPHEN 325 MG PO TABS: 650.0000 mg | ORAL_TABLET | ORAL | Status: DC | PRN

## 2013-01-21 NOTE — ED Notes (Signed)
Telepsych started

## 2013-01-21 NOTE — ED Notes (Signed)
Received telephone call from Patient's son Darren requesting update. This RN spoke with Dr. Juleen China regarding patient status and Son was updated that patient continues to be in our care at this time. Son states that he is the [place] and [place], telephone number has been recorded under demographics. Son states he lives in [place] and will be traveling to [place] tomorrow morning to assist in his father's care. This RN advised Darren to bring paperwork r/t his POA/HCPOA status.  Son requests to be updated in the case of any status change with the patient.

## 2013-01-21 NOTE — ED Notes (Signed)
Charge and [facility] notified for sitter need.

## 2013-01-21 NOTE — ED Notes (Signed)
Tele psych camera placed in room, and plugged into outlet and Ethernet cable connected

## 2013-01-21 NOTE — ED Notes (Signed)
Pt sitting with his wife in [address].

## 2013-01-21 NOTE — ED Provider Notes (Signed)
CSN: [id number]     Arrival date & time 01/21/13  1522 History   First MD Initiated Contact with Patient 01/21/13 1523     No chief complaint on file.  (Consider location/radiation/quality/duration/timing/severity/associated sxs/prior Treatment) HPI  79 year old male with depression and and passive suicidal ideation. Patient was actually visiting his wife in the emergency. When speaking with pt about his wife, the patient broke down and began crying. Apparently they have both been having ongoing health issues recently. He states increasing depression. He says he just wants to go to sleep and not wake up. Denies having a specific plan. He does have a long-standing history of depression. He reports taking his medications as prescribed. Denies previous suicide attempt. Denies any drug use. No alcohol use.  Past Medical History  Diagnosis Date  . Hypothyroidism     takes Levothyroxine  . Coronary artery disease     valve replaced  . Sleep apnea 2011    does not use CPAP  . Seizures     cannot remember last seizure  . Arthritis     all over body  . Anxiety     comes and goes  . Depression     comes and goes  . Dysrhythmia     recurrent arrhythmia w/AV replacement  . CHF (congestive heart failure) 02/10/11 note    has compensated CHF per Dr. Katrinka Blazing note  . Constipation 08/2009    after surgery  . Urinary tract bacterial infections 08/2010    from surgery and catheters   Past Surgical History  Procedure Laterality Date  . Cardiac valve replacement  2001  . Back surgery  2011-last one    6 surgeries  . Cardiac catheterization  2001    before valve replacement  . Joint replacement  08/2010    right knee  . Joint replacement  2004    left knee  . Transurethral incision of prostate  03/02/2011    Procedure: TRANSURETHRAL INCISION OF THE PROSTATE (TUIP);  Surgeon: [name];  Location: [facility] ORS;  Service: Urology;  Laterality: N/A;  . Esophageal biopsy  03/02/2011    Procedure:  BIOPSY;  Surgeon: [name];  Location: [facility];  Service: Urology;;  . Video bronchoscopy Bilateral 05/31/2012    Procedure: VIDEO BRONCHOSCOPY WITHOUT FLUORO;  Surgeon: Barbaraann Share, MD;  Location: [facility];  Service: Cardiopulmonary;  Laterality: Bilateral;  . Talc pleurodesis Right 12/25/2012    Procedure: Lurlean Nanny;  Surgeon: Delight Ovens, MD;  Location: [facility] OR;  Service: Thoracic;  Laterality: Right;  . Video bronchoscopy N/A 01/09/2013    Procedure: VIDEO BRONCHOSCOPY;  Surgeon: Delight Ovens, MD;  Location: [facility] OR;  Service: Thoracic;  Laterality: N/A;  . Video assisted thoracoscopy Right 01/09/2013    Procedure: VIDEO ASSISTED THORACOSCOPY;  Surgeon: Delight Ovens, MD;  Location: [facility] OR;  Service: Thoracic;  Laterality: Right;  . Pleural effusion drainage Right 01/09/2013    Procedure: DRAINAGE OF PLEURAL EFFUSION;  Surgeon: Delight Ovens, MD;  Location: [facility] OR;  Service: Thoracic;  Laterality: Right;  AND PLEURODESIS  . Chest tube insertion Right 01/09/2013    Procedure: INSERTION PLEURAL DRAINAGE CATHETER;  Surgeon: Delight Ovens, MD;  Location: [facility] OR;  Service: Thoracic;  Laterality: Right;  . Pleural biopsy N/A 01/09/2013    Procedure: PLEURAL BIOPSY;  Surgeon: Delight Ovens, MD;  Location: [facility] OR;  Service: Thoracic;  Laterality: N/A;  . Talc pleurodesis Right 01/09/2013    Procedure:  TALC PLEURADESIS;  Surgeon: Delight Ovens, MD;  Location: [facility] OR;  Service: Thoracic;  Laterality: Right;   No family history on file. History  Substance Use Topics  . Smoking status: Former Smoker -- 1.00 packs/day for 30 years    Types: Cigarettes    Quit date: 02/23/1984  . Smokeless tobacco: Not on file  . Alcohol Use: No    Review of Systems  All systems reviewed and negative, other than as noted in HPI.   Allergies  Penicillins and Warfarin and related  Home Medications   Current Outpatient Rx  Name  Route  Sig  Dispense  Refill  . amiodarone  (PACERONE) 200 MG tablet   Oral   Take 100 mg by mouth daily.          Marland Kitchen atorvastatin (LIPITOR) 10 MG tablet   Oral   Take 10 mg by mouth every morning.         . calcium-vitamin D (OSCAL WITH D) 500-200 MG-UNIT per tablet   Oral   Take 1 tablet by mouth daily.         . clonazePAM (KLONOPIN) 0.5 MG tablet   Oral   Take 0.5 mg by mouth daily.          . dabigatran (PRADAXA) 150 MG CAPS   Oral   Take 150 mg by mouth every 12 (twelve) hours.          Marland Kitchen levothyroxine (SYNTHROID, LEVOTHROID) 50 MCG tablet   Oral   Take 50 mcg by mouth every morning.          . metoprolol (TOPROL-XL) 50 MG 24 hr tablet   Oral   Take 25 mg by mouth daily.          . Misc Natural Products (OSTEO BI-FLEX ADV DOUBLE ST PO)   Oral   Take 1 tablet by mouth daily.          Marland Kitchen oxyCODONE-acetaminophen (PERCOCET/ROXICET) 5-325 MG per tablet   Oral   Take 1-2 tablets by mouth every 4 (four) hours as needed.   40 tablet   0   . phenytoin (DILANTIN) 100 MG ER capsule   Oral   Take 200 mg by mouth 2 (two) times daily.         . polyethylene glycol (MIRALAX / GLYCOLAX) packet   Oral   Take 8.5 g by mouth daily.          Marland Kitchen rOPINIRole (REQUIP) 0.5 MG tablet   Oral   Take 0.5 mg by mouth every 8 (eight) hours.          . sertraline (ZOLOFT) 50 MG tablet   Oral   Take 100 mg by mouth 2 (two) times daily.         . Tetrahydrozoline HCl (VISINE OP)   Both Eyes   Place 1 drop into both eyes daily.         . vitamin C (ASCORBIC ACID) 500 MG tablet   Oral   Take 500 mg by mouth daily.          BP 103/68  Pulse 114  Temp(Src) 98.2 F (36.8 C) (Oral)  Resp 18  SpO2 99% Physical Exam  Nursing note and vitals reviewed. Constitutional: He appears well-developed and well-nourished. No distress.  HENT:  Head: Normocephalic and atraumatic.  Eyes: Conjunctivae are normal. Right eye exhibits no discharge. Left eye exhibits no discharge.  Neck: Neck supple.   Cardiovascular: Normal rate, regular rhythm  and normal heart sounds.  Exam reveals no gallop and no friction rub.   No murmur heard. Pulmonary/Chest: Effort normal and breath sounds normal. No respiratory distress.  Abdominal: Soft. He exhibits no distension. There is no tenderness.  Musculoskeletal: He exhibits no edema and no tenderness.  Neurological: He is alert. No cranial nerve deficit. He exhibits normal muscle tone. Coordination normal.  Skin: Skin is warm and dry. He is not diaphoretic.  Psychiatric:  Anxious. Crying at times. Does not appear to be responding to internal stimuli.    ED Course  Procedures (including critical care time) Labs Review Labs Reviewed  PHENYTOIN LEVEL, TOTAL - Abnormal; Notable for the following:    Phenytoin Lvl <2.5 (*)    All other components within normal limits  URINALYSIS, ROUTINE W REFLEX MICROSCOPIC   Imaging Review No results found.  MDM   1. Depression   2. Suicidal ideation     79 year old male with depression and passive suicidal ideation. Patient lives with his wife. She is going to be admitted to the hospital which means he would otherwise go home alone. He has no family within the area. We'll obtain psychiatry consultation for further recommendations. Pt's home meds ordered aside from blood thinners as currently scheduled for port removal tomorrow. Dilantin level sub therapeutic. Pt not sure if takes for seizure hx or psych reasons. Will load.   Discussed with one of pt's son's Mccabe Gloria, [phone] with pt's consent. Son reports long history of depression. Son fine with calling him with updates or for more information.     Raeford Razor, MD 01/21/13 [phone]

## 2013-01-21 NOTE — ED Provider Notes (Signed)
Patient was assessed by TTS.  He does not meet any criteria for admission to the hospital for medication change.  He is deemed safe for discharge. I spoke with the patient and his son via telephone and he will be assisted to a cab, for transportation home Although patient voiced not wanting to wake up he has no active plan for suicide.  He, states he is new in people who have committed suicide, and he would not do that to his family.  Arman Filter, NP 01/21/13 2257

## 2013-01-21 NOTE — ED Notes (Signed)
Pt. Spouse of this RN's patient. Dr. Juleen China in pt.s wife's room to update this patient on wife's condition and this pt became increasing upset, crying  and inconsolable. Pt sts wants to die and does not want to live. Dr. Juleen China requesting this RN to contact registration to register pt to be seen. This RN updated pt and patient sts is willing. Friend consoling pt. Pt given juice and [place] sandwich meal. Pt no longer crying, but still sad. Situation explained to Wandra Mannan, Cris RN & dr. Juleen China updated. Pt to go to room 41. Pt registered. Alert and oriented x4 in NAD

## 2013-01-21 NOTE — BH Assessment (Addendum)
Tele Assessment Note   [name] is an 79 y.o. male. Pt came to [facility] today with his wife, who is having significant medical issues.  While here, pt made comment that he wanted to go to sleep and not wake up.  During ACT assessment, pt detailed that both he and his wife are having significant, chronic medical issues that are getting worse and not better.  Pt presented as quite tearful and depressed.  Pt said several times that he would be very happy to go to sleep and not wake up.  However, pt said he would never commit suicide.  Pt states he knows families where there has been a suicide and "I would never do that to my family."  Pt admitted to suicidal thoughts intermittantly, but denied and current SI, denied any plan or intent.  Pt denies HI/AV as well.  Pt has one psychiatric hospitalization in the 1970s but no psych treatment of any kind since then.  Axis I: Depressive Disorder NOS Axis II: Deferred Axis III:  Past Medical History  Diagnosis Date  . Hypothyroidism     takes Levothyroxine  . Coronary artery disease     valve replaced  . Sleep apnea 2011    does not use CPAP  . Seizures     cannot remember last seizure  . Arthritis     all over body  . Anxiety     comes and goes  . Depression     comes and goes  . Dysrhythmia     recurrent arrhythmia w/AV replacement  . CHF (congestive heart failure) 02/10/11 note    has compensated CHF per Dr. Katrinka Blazing note  . Constipation 08/2009    after surgery  . Urinary tract bacterial infections 08/2010    from surgery and catheters   Axis IV: health issues, wife's health issues Axis V: 51-60 moderate symptoms  Past Medical History:  Past Medical History  Diagnosis Date  . Hypothyroidism     takes Levothyroxine  . Coronary artery disease     valve replaced  . Sleep apnea 2011    does not use CPAP  . Seizures     cannot remember last seizure  . Arthritis     all over body  . Anxiety     comes and goes  . Depression    comes and goes  . Dysrhythmia     recurrent arrhythmia w/AV replacement  . CHF (congestive heart failure) 02/10/11 note    has compensated CHF per Dr. Katrinka Blazing note  . Constipation 08/2009    after surgery  . Urinary tract bacterial infections 08/2010    from surgery and catheters    Past Surgical History  Procedure Laterality Date  . Cardiac valve replacement  2001  . Back surgery  2011-last one    6 surgeries  . Cardiac catheterization  2001    before valve replacement  . Joint replacement  08/2010    right knee  . Joint replacement  2004    left knee  . Transurethral incision of prostate  03/02/2011    Procedure: TRANSURETHRAL INCISION OF THE PROSTATE (TUIP);  Surgeon: [name];  Location: [facility];  Service: Urology;  Laterality: N/A;  . Esophageal biopsy  03/02/2011    Procedure: BIOPSY;  Surgeon: [name];  Location: [facility];  Service: Urology;;  . Video bronchoscopy Bilateral 05/31/2012    Procedure: VIDEO BRONCHOSCOPY WITHOUT FLUORO;  Surgeon: Barbaraann Share, MD;  Location:  [facility];  Service: Cardiopulmonary;  Laterality: Bilateral;  . Talc pleurodesis Right 12/25/2012    Procedure: Lurlean Nanny;  Surgeon: Delight Ovens, MD;  Location: [facility] OR;  Service: Thoracic;  Laterality: Right;  . Video bronchoscopy N/A 01/09/2013    Procedure: VIDEO BRONCHOSCOPY;  Surgeon: Delight Ovens, MD;  Location: [facility] OR;  Service: Thoracic;  Laterality: N/A;  . Video assisted thoracoscopy Right 01/09/2013    Procedure: VIDEO ASSISTED THORACOSCOPY;  Surgeon: Delight Ovens, MD;  Location: [facility] OR;  Service: Thoracic;  Laterality: Right;  . Pleural effusion drainage Right 01/09/2013    Procedure: DRAINAGE OF PLEURAL EFFUSION;  Surgeon: Delight Ovens, MD;  Location: [facility] OR;  Service: Thoracic;  Laterality: Right;  AND PLEURODESIS  . Chest tube insertion Right 01/09/2013    Procedure: INSERTION PLEURAL DRAINAGE CATHETER;  Surgeon: Delight Ovens, MD;  Location: [facility] OR;  Service:  Thoracic;  Laterality: Right;  . Pleural biopsy N/A 01/09/2013    Procedure: PLEURAL BIOPSY;  Surgeon: Delight Ovens, MD;  Location: [facility] OR;  Service: Thoracic;  Laterality: N/A;  . Talc pleurodesis Right 01/09/2013    Procedure: Lurlean Nanny;  Surgeon: Delight Ovens, MD;  Location: [facility] OR;  Service: Thoracic;  Laterality: Right;    Family History: No family history on file.  Social History:  reports that he quit smoking about 28 years ago. His smoking use included Cigarettes. He has a 30 pack-year smoking history. He does not have any smokeless tobacco history on file. He reports that he does not drink alcohol or use illicit drugs.  Additional Social History:  Alcohol / Drug Use History of alcohol / drug use?: No history of alcohol / drug abuse  CIWA: CIWA-Ar BP: 103/68 mmHg Pulse Rate: 114 COWS:    Allergies:  Allergies  Allergen Reactions  . Penicillins Other (See Comments)    Pt doesn't remember the reaction  . Warfarin And Related Other (See Comments)    Pt gets the shakes    Home Medications:  (Not in a hospital admission)  OB/GYN Status:  No LMP for male patient.  General Assessment Data Location of Assessment: [facility] ED ACT Assessment: Yes Is this a Tele or Face-to-Face Assessment?: Tele Assessment Is this an Initial Assessment or a Re-assessment for this encounter?: Initial Assessment Living Arrangements: Spouse/significant other Can pt return to current living arrangement?: Yes Admission Status: Voluntary     [facility] Crisis Care Plan Living Arrangements: Spouse/significant other Name of Psychiatrist: none Name of Therapist: none     Risk to self Suicidal Ideation: No-Not Currently/Within Last 6 Months Suicidal Intent: No Is patient at risk for suicide?: No Suicidal Plan?: No Access to Means: No What has been your use of drugs/alcohol within the last 12 months?: no use Previous Attempts/Gestures: No Intentional Self Injurious Behavior: None Family  Suicide History: No Recent stressful life event(s): Other (Comment) (medical issues of pt and wife) Persecutory voices/beliefs?: No Depression: Yes Depression Symptoms: Despondent;Tearfulness Substance abuse history and/or treatment for substance abuse?: No Suicide prevention information given to non-admitted patients: Not applicable  Risk to Others Homicidal Ideation: No Thoughts of Harm to Others: No Current Homicidal Intent: No Current Homicidal Plan: No Access to Homicidal Means: No History of harm to others?: No Assessment of Violence: None Noted Does patient have access to weapons?: No Criminal Charges Pending?: No Does patient have a court date: No  Psychosis Hallucinations: None noted Delusions: None noted  Mental Status Report Appear/Hygiene: Other (Comment) (casual) Eye  Contact: Poor Motor Activity: Unremarkable Speech: Logical/coherent Level of Consciousness: Quiet/awake Mood: Depressed Affect: Appropriate to circumstance Anxiety Level: None Thought Processes: Coherent;Relevant Judgement: Unimpaired Orientation: Person;Place;Time;Situation Obsessive Compulsive Thoughts/Behaviors: None  Cognitive Functioning Concentration: Normal Memory: Recent Intact;Remote Intact IQ: Average Insight: Good Impulse Control: Good Appetite: Poor Weight Loss: 12 Weight Gain: 0 Sleep: No Change Total Hours of Sleep:  (sleeps if he takes his medicine) Vegetative Symptoms: None  ADLScreening [facility] Assessment Services) Patient's cognitive ability adequate to safely complete daily activities?: Yes Patient able to express need for assistance with ADLs?: Yes Independently performs ADLs?: Yes (appropriate for developmental age)  Prior Inpatient Therapy Prior Inpatient Therapy: Yes Prior Therapy Dates: 30s Prior Therapy Facilty/Provider(s): [facility] hospital Reason for Treatment: psych/depression  Prior Outpatient Therapy Prior Outpatient Therapy: No  ADL Screening  (condition at time of admission) Patient's cognitive ability adequate to safely complete daily activities?: Yes Patient able to express need for assistance with ADLs?: Yes Independently performs ADLs?: Yes (appropriate for developmental age)  Home Assistive Devices/Equipment Home Assistive Devices/Equipment: None      Values / Beliefs Cultural Requests During Hospitalization: None Spiritual Requests During Hospitalization: None   Advance Directives (For Healthcare) Advance Directive: Patient has advance directive, copy in chart    Additional Information 1:1 In Past 12 Months?: No CIRT Risk: No Elopement Risk: No Does patient have medical clearance?: Yes     Disposition: Discussed this pt with Maryjean Morn, PA at [facility] and with Sharen Hones at [facility].  Pt does not meet criteria for inpt psych admission and will be referred to continue to work with his current provider for depression medication. Disposition Initial Assessment Completed for this Encounter: Yes Disposition of Patient: Other dispositions Other disposition(s): Information only  Lorri Frederick 01/21/2013 10:09 PM

## 2013-01-22 ENCOUNTER — Encounter (HOSPITAL_COMMUNITY)
Admission: RE | Disposition: A | Discharge status: Home / Self Care | Payer: Self-pay | Source: Ambulatory Visit | Attending: Cardiothoracic Surgery

## 2013-01-22 ENCOUNTER — Ambulatory Visit (HOSPITAL_COMMUNITY): Payer: Medicare Other

## 2013-01-22 ENCOUNTER — Ambulatory Visit (HOSPITAL_COMMUNITY)
Admission: RE | Admit: 2013-01-22 | Discharge: 2013-01-22 | Disposition: A | Discharge status: Home / Self Care | Payer: Medicare Other | Source: Ambulatory Visit | Attending: Cardiothoracic Surgery | Admitting: Cardiothoracic Surgery

## 2013-01-22 DIAGNOSIS — J910 Malignant pleural effusion: Secondary | ICD-10-CM | POA: Insufficient documentation

## 2013-01-22 DIAGNOSIS — J90 Pleural effusion, not elsewhere classified: Secondary | ICD-10-CM

## 2013-01-22 DIAGNOSIS — C3490 Malignant neoplasm of unspecified part of unspecified bronchus or lung: Secondary | ICD-10-CM | POA: Insufficient documentation

## 2013-01-22 DIAGNOSIS — Z4682 Encounter for fitting and adjustment of non-vascular catheter: Secondary | ICD-10-CM | POA: Insufficient documentation

## 2013-01-22 HISTORY — PX: REMOVAL OF PLEURAL DRAINAGE CATHETER: SHX5080

## 2013-01-22 SURGERY — REMOVAL, CLOSED DRAINAGE CATHETER SYSTEM, PLEURAL
Anesthesia: LOCAL | Laterality: Right | Wound class: Clean Contaminated

## 2013-01-22 MED ORDER — OXYCODONE-ACETAMINOPHEN 5-325 MG PO TABS: 1.0000 | ORAL_TABLET | ORAL | Status: AC | PRN

## 2013-01-22 NOTE — Procedures (Signed)
Drainage and Removal of Pleur-x Catheter   [name] [id number] 1927/09/26  Procedure: Drainge and Removal of Pleur-X Drainage catheter Indications: Recurrent Malignant Pleural effusion, Mesothelioma  Procedure Details  Consent: Risks of procedure as well as the alternatives and risks of each were explained to the (patient/caregiver).  Consent for procedure obtained. Time Out: Verified patient identification, verified procedure, site/side was marked, verified correct patient position, special equipment/implants available, medications/allergies/relevent history reviewed, required imaging and test results available.  Performed   Local Anesthesia Used:Lidocaine 1% plain; 7mL Amount of Fluid Drained: 10mL Character of Fluid: clear   Patients right chest was prepped and draped in sterile fashion.  The suture around the Right sided Pleur-x drainage catheter was removed without difficulty.  The area around the cuff of the Pleur-x catheter was anesthestized with 1% Lidocaine without Epinephrine.  Then blunt dissection was performed of adhesions developed around the Pleur-x Catheter cuff.  The cuff was completely removed without difficulty.  The site was sutured with a single 3-0 interrupted Nylon suture.  The area was cleaned and a dry dressing was placed.  There was minimal blood loss and the patient tolerated the procedure with minimal discomfort.  Estimated blood loss: minimal  [name] 01/22/2013, 1:12 PM

## 2013-01-22 NOTE — Progress Notes (Signed)
Minor room case complete.  Tolerated well.  Discharge instructions given by [name], PA, Pt voices understanding.

## 2013-01-23 NOTE — Telephone Encounter (Signed)
Yes if low because on amiodarone.

## 2013-01-23 NOTE — Telephone Encounter (Signed)
Yes we should fill it because amiodarone has caused hypothyroid state.

## 2013-01-24 DIAGNOSIS — C384 Malignant neoplasm of pleura: Secondary | ICD-10-CM

## 2013-01-25 ENCOUNTER — Encounter (HOSPITAL_COMMUNITY): Payer: Self-pay | Admitting: Cardiothoracic Surgery

## 2013-01-26 ENCOUNTER — Telehealth: Payer: Self-pay

## 2013-01-26 NOTE — Telephone Encounter (Signed)
Pt C/O increased dyspnea and weakness today. His rt side lungs sounds are diminished and Lt side has crackle sounds. He also has developed bilateral pitting edema in legs and feet. I advised that patient see PCP or go to the ED for evaluation today.

## 2013-01-29 ENCOUNTER — Ambulatory Visit: Payer: Medicare Other

## 2013-01-29 NOTE — ED Provider Notes (Signed)
Medical screening examination/treatment/procedure(s) were performed by non-physician practitioner and as supervising physician I was immediately available for consultation/collaboration.  Raeford Razor, MD 01/29/13 [phone]

## 2013-02-07 ENCOUNTER — Telehealth: Payer: Self-pay | Admitting: *Deleted

## 2013-02-07 NOTE — Telephone Encounter (Signed)
Patient called to cancel 10/23 appt. Does not wish to reschedule.

## 2013-02-08 ENCOUNTER — Ambulatory Visit: Payer: Medicare Other | Admitting: Oncology

## 2013-02-12 ENCOUNTER — Other Ambulatory Visit: Payer: Self-pay | Admitting: *Deleted

## 2013-02-12 DIAGNOSIS — J90 Pleural effusion, not elsewhere classified: Secondary | ICD-10-CM

## 2013-02-15 ENCOUNTER — Ambulatory Visit: Payer: Medicare Other | Admitting: Cardiothoracic Surgery

## 2013-02-15 NOTE — Progress Notes (Deleted)
[address]       [place] [address]             [phone]                 [name] [facility] Medical Record #[id number] Date of Birth: 1927/12/15  Referring: Dr Myrle Sheng Primary Care: Darnelle Bos, MD  Chief Complaint:   Malignant pleural effusion  01/09/2013  OPERATIVE REPORT  PREOPERATIVE DIAGNOSIS: Malignant right pleural effusion, recurrent.  POSTOPERATIVE DIAGNOSIS: Malignant right pleural effusion, recurrent.  SURGICAL PROCEDURE: Bronchoscopy, right video-assisted thoracoscopy,  drainage of pleural effusion, pleural biopsy, talc pleurodesis,  placement of thorax pleural drainage catheter and removal of  nonfunctioning PleurX catheter.  SURGEON: Sheliah Plane, MD  History of Present Illness:       Current Activity/ Functional Status:  Patient is independent with mobility/ambulation, transfers, ADL's, IADL's.  Zubrod Score: At the time of surgery this patient's most appropriate activity status/level should be described as: []  Normal activity, no symptoms [x]  Symptoms, fully ambulatory []  Symptoms, in bed less than or equal to 50% of the time []  Symptoms, in bed greater than 50% of the time but less than 100% []  Bedridden []  Moribund   Past Medical History  Diagnosis Date  . Hypothyroidism     takes Levothyroxine  . Coronary artery disease     valve replaced  . Sleep apnea 2011    does not use CPAP  . Seizures     cannot remember last seizure  . Arthritis     all over body  . Anxiety     comes and goes  . Depression     comes and goes  . Dysrhythmia     recurrent arrhythmia w/AV replacement  . CHF (congestive heart failure) 02/10/11 note    has compensated CHF per Dr. Katrinka Blazing note  . Constipation 08/2009    after surgery  . Urinary tract bacterial infections 08/2010    from surgery and catheters    Past Surgical History  Procedure Laterality Date  . Cardiac valve replacement  2001  . Back surgery   2011-last one    6 surgeries  . Cardiac catheterization  2001    before valve replacement  . Joint replacement  08/2010    right knee  . Joint replacement  2004    left knee  . Transurethral incision of prostate  03/02/2011    Procedure: TRANSURETHRAL INCISION OF THE PROSTATE (TUIP);  Surgeon: [name];  Location: [facility];  Service: Urology;  Laterality: N/A;  . Esophageal biopsy  03/02/2011    Procedure: BIOPSY;  Surgeon: [name];  Location: [facility];  Service: Urology;;  . Video bronchoscopy Bilateral 05/31/2012    Procedure: VIDEO BRONCHOSCOPY WITHOUT FLUORO;  Surgeon: Barbaraann Share, MD;  Location[facility];  Service: Cardiopulmonary;  Laterality: Bilateral;  . Talc pleurodesis Right 12/25/2012    Procedure: Lurlean Nanny;  Surgeon: Delight Ovens, MD;  Location: [facility] OR;  Service: Thoracic;  Laterality: Right;  . Video bronchoscopy N/A 01/09/2013    Procedure: VIDEO BRONCHOSCOPY;  Surgeon: Delight Ovens, MD;  Location: [facility] OR;  Service: Thoracic;  Laterality: N/A;  . Video assisted thoracoscopy Right 01/09/2013    Procedure: VIDEO ASSISTED THORACOSCOPY;  Surgeon: Delight Ovens, MD;  Location: [facility] OR;  Service: Thoracic;  Laterality: Right;  . Pleural effusion drainage Right 01/09/2013    Procedure: DRAINAGE OF PLEURAL EFFUSION;  Surgeon: Ramon Dredge  Bari Rylen Hou, MD;  Location: [facility] OR;  Service: Thoracic;  Laterality: Right;  AND PLEURODESIS  . Chest tube insertion Right 01/09/2013    Procedure: INSERTION PLEURAL DRAINAGE CATHETER;  Surgeon: Delight Ovens, MD;  Location: [facility] OR;  Service: Thoracic;  Laterality: Right;  . Pleural biopsy N/A 01/09/2013    Procedure: PLEURAL BIOPSY;  Surgeon: Delight Ovens, MD;  Location: [facility] OR;  Service: Thoracic;  Laterality: N/A;  . Talc pleurodesis Right 01/09/2013    Procedure: Lurlean Nanny;  Surgeon: Delight Ovens, MD;  Location: [facility] OR;  Service: Thoracic;  Laterality: Right;  . Removal of pleural drainage catheter Right  01/22/2013    Procedure: REMOVAL OF PLEURAL DRAINAGE CATHETER;  Surgeon: Delight Ovens, MD;  Location: [facility] OR;  Service: Thoracic;  Laterality: Right;    No family history on file.  History   Social History  . Marital Status: Married    Spouse Name: N/A    Number of Children: N/A  . Years of Education: N/A   Occupational History  . Not on file.   Social History Main Topics  . Smoking status: Former Smoker -- 1.00 packs/day for 30 years    Types: Cigarettes    Quit date: 02/23/1984  . Smokeless tobacco: Not on file  . Alcohol Use: No  . Drug Use: No  . Sexual Activity:    Other Topics Concern  . Not on file   Social History Narrative  . No narrative on file    History  Smoking status  . Former Smoker -- 1.00 packs/day for 30 years  . Types: Cigarettes  . Quit date: 02/23/1984  Smokeless tobacco  . Not on file    History  Alcohol Use No     Allergies  Allergen Reactions  . Penicillins Other (See Comments)    Pt doesn't remember the reaction  . Warfarin And Related Other (See Comments)    Pt gets the shakes    Current Outpatient Prescriptions  Medication Sig Dispense Refill  . amiodarone (PACERONE) 200 MG tablet Take 100 mg by mouth daily.       Marland Kitchen atorvastatin (LIPITOR) 10 MG tablet Take 10 mg by mouth every morning.      . calcium-vitamin D (OSCAL WITH D) 500-200 MG-UNIT per tablet Take 1 tablet by mouth daily.      . clonazePAM (KLONOPIN) 0.5 MG tablet Take 0.5 mg by mouth daily.       . dabigatran (PRADAXA) 150 MG CAPS Take 150 mg by mouth every 12 (twelve) hours.       Marland Kitchen levothyroxine (SYNTHROID, LEVOTHROID) 50 MCG tablet Take 50 mcg by mouth every morning.       Marland Kitchen levothyroxine (SYNTHROID, LEVOTHROID) 75 MCG tablet TAKE 1 TABLET EVERY MORNING ON EMPTY STOMACH  30 tablet  5  . metoprolol (TOPROL-XL) 50 MG 24 hr tablet Take 25 mg by mouth daily.       . Misc Natural Products (OSTEO BI-FLEX ADV DOUBLE ST PO) Take 1 tablet by mouth daily.       Marland Kitchen  oxyCODONE-acetaminophen (PERCOCET/ROXICET) 5-325 MG per tablet Take 1-2 tablets by mouth every 4 (four) hours as needed.  40 tablet  0  . oxyCODONE-acetaminophen (ROXICET) 5-325 MG per tablet Take 1 tablet by mouth every 4 (four) hours as needed for pain.  30 tablet  0  . phenytoin (DILANTIN) 100 MG ER capsule Take 200 mg by mouth 2 (two) times daily.      Marland Kitchen  polyethylene glycol (MIRALAX / GLYCOLAX) packet Take 8.5 g by mouth daily.       Marland Kitchen rOPINIRole (REQUIP) 0.5 MG tablet Take 0.5 mg by mouth every 8 (eight) hours.       . sertraline (ZOLOFT) 50 MG tablet Take 100 mg by mouth 2 (two) times daily.      . Tetrahydrozoline HCl (VISINE OP) Place 1 drop into both eyes daily.      . vitamin C (ASCORBIC ACID) 500 MG tablet Take 500 mg by mouth daily.       No current facility-administered medications for this visit.      Physical Exam: There were no vitals taken for this visit.  General appearance: alert, cooperative, appears stated age and no distress Neurologic: intact Heart: regular rate and rhythm, S1, S2 normal, no murmur, click, rub or gallop and normal apical impulse Lungs: clear to auscultation bilaterally Abdomen: soft, non-tender; bowel sounds normal; no masses,  no organomegaly Extremities: extremities normal, atraumatic, no cyanosis or edema and Homans sign is negative, no sign of DVT Patient has no cervical supraclavicular or axillary adenopathy, he has no pedal edema, pedal pulses are intact bilaterally   Diagnostic Studies & Laboratory data:     Recent Radiology Findings:  Ct Chest Wo Contrast  01/05/2013   CLINICAL DATA:  Follow up malignant pleural effusion status post drainage tube placement. History of mesothelioma.  EXAM: CT CHEST WITHOUT CONTRAST  TECHNIQUE: Multidetector CT imaging of the chest was performed following the standard protocol without IV contrast.  COMPARISON:  Radiographs 01/03/2013, PET-CT 05/19/2012 and chest CT 04/26/2012.  FINDINGS: There is increased  right pericardiac nodularity, measuring up to 1.8 cm on image 42. As evaluated in the non contrast state, the mediastinum otherwise has a stable appearance without pathologically enlarged lymph nodes. Scattered small lymph nodes are unchanged. There is scattered atherosclerosis of the aorta, great vessels and coronary arteries status post median sternotomy and aortic valve replacement.  Right PleurX catheter extends to the posterior right costophrenic angle. A partially loculated right pleural effusion extends into the fissures and has decreased in volume compared with the prior CT. There is some dependent pleural thickening without obvious dominant pleural-based mass on non contrast imaging. There is some high-density inferiorly in the right pleural space which may be secondary to prior pleurodesis. No significant pleural effusion is present on the left. Pleural calcifications are again noted. The right pericardiac nodularity may involve the pericardium, although there is no significant pericardial effusion.  There is partial right middle and lower lobe atelectasis with air bronchograms. No masses or nodules are seen within the aerated portions of the lungs.  The visualized upper abdomen appears stable. There are stable compression deformities at T4 and T8. No epidural tumor is identified.  IMPRESSION: 1. Right PleurX catheter appears well positioned. There is a moderate size loculated right pleural effusion with pleural thickening, but no obvious pleural-based mass on non contrast imaging. 2. Increased right pericardiac nodularity suspicious for tumor progression. No other discrete tumor visualized. 3. Interval improved aeration of the right middle and lower lobes. 4. Stable thoracic compression deformities.   Electronically Signed   By: Roxy Horseman   On: 01/05/2013 08:16   Dg Chest 2 View  01/03/2013   *RADIOLOGY REPORT*  Clinical Data: History of malignant right pleural effusion with adenocarcinoma  CHEST -  2 VIEW  Comparison: Chest x-ray of 12/20/2012  Findings: There has been definite interval increase in volume of the right pleural effusion with  right basilar volume loss.  A right pleurex catheter is noted.  There may be a tiny left effusion present.  Mild cardiomegaly is stable.  Median sternotomy sutures are noted and an aortic valve replacement is present.  IMPRESSION: Interval increase in volume of the right pleural effusion with increase in right basilar volume loss.   Original Report Authenticated By: Dwyane Dee, M.D.     11/23/2012   *RADIOLOGY REPORT*  Clinical Data: Follow up pleural effusion, history mesothelioma, coronary artery disease, CHF, smoking  CHEST - 2 VIEW  Comparison: 10/05/2012  Findings: Right basilar thoracostomy tube. Enlargement of cardiac silhouette post median sternotomy and AVR. Mediastinal contours and pulmonary vascularity normal. Scattered pleural plaques, partially calcified by prior CT, consistent with asbestos exposure. Blunting of the right costophrenic angle by chronic effusion or pleural thickening. No definite acute infiltrate, pneumothorax, or acute osseous findings. Prior lumbar fusion.  IMPRESSION: Chronic pleural changes as above. Enlargement of cardiac silhouette post AVR.   Original Report Authenticated By: Ulyses Southward, M.D.   Recent Lab Findings: Lab Results  Component Value Date   WBC 11.1* 01/13/2013   HGB 9.8* 01/13/2013   HCT 28.7* 01/13/2013   PLT 454* 01/13/2013   GLUCOSE 92 01/13/2013   ALT 15 01/11/2013   AST 15 01/11/2013   NA 135 01/13/2013   K 4.6 01/13/2013   CL 100 01/13/2013   CREATININE 1.18 01/13/2013   BUN 15 01/13/2013   CO2 24 01/13/2013   TSH 7.497* 06/17/2010   INR 1.79* 01/08/2013      Assessment / Plan:     Delight Ovens  MD  Beeper [phone] Office [phone] 02/15/2013 11:31 AM

## 2013-02-20 NOTE — Progress Notes (Signed)
This encounter was created in error - please disregard.

## 2013-02-21 LAB — AFB CULTURE WITH SMEAR (NOT AT ARMC): Acid Fast Smear: NONE SEEN

## 2013-02-22 ENCOUNTER — Other Ambulatory Visit: Payer: Self-pay

## 2013-03-19 ENCOUNTER — Other Ambulatory Visit: Payer: Self-pay | Admitting: Neurology

## 2013-03-28 ENCOUNTER — Telehealth: Payer: Self-pay | Admitting: *Deleted

## 2013-03-28 NOTE — Telephone Encounter (Signed)
Patient needs refill on amiodarone to be sent to [facility]. Thanks, MI

## 2013-03-29 MED ORDER — AMIODARONE HCL 200 MG PO TABS: 100.0000 mg | ORAL_TABLET | Freq: Every day | ORAL | Status: DC

## 2013-03-29 NOTE — Telephone Encounter (Signed)
done

## 2013-05-13 ENCOUNTER — Other Ambulatory Visit: Payer: Self-pay | Admitting: *Deleted

## 2013-05-13 DIAGNOSIS — Z79899 Other long term (current) drug therapy: Secondary | ICD-10-CM

## 2013-05-13 DIAGNOSIS — I4891 Unspecified atrial fibrillation: Secondary | ICD-10-CM

## 2013-06-06 ENCOUNTER — Other Ambulatory Visit: Payer: Self-pay

## 2013-06-06 MED ORDER — PHENYTOIN SODIUM EXTENDED 100 MG PO CAPS: 200.0000 mg | ORAL_CAPSULE | Freq: Two times a day (BID) | ORAL | Status: DC

## 2013-06-08 ENCOUNTER — Other Ambulatory Visit: Payer: Self-pay

## 2013-06-11 ENCOUNTER — Telehealth: Payer: Self-pay

## 2013-06-11 MED ORDER — METOPROLOL SUCCINATE ER 25 MG PO TB24: 25.0000 mg | ORAL_TABLET | Freq: Every day | ORAL | Status: DC

## 2013-06-11 NOTE — Telephone Encounter (Signed)
Metoprolol Succ dosage verified per ECW o/v 12/20/12 pt taking 25mg  daily.Rx sent to pt pharmacy

## 2013-06-11 NOTE — Addendum Note (Signed)
Addended by: Lamar Laundry on: 06/11/2013 12:45 PM   Modules accepted: Orders

## 2013-06-12 ENCOUNTER — Other Ambulatory Visit: Payer: Self-pay

## 2013-06-12 MED ORDER — LEVOTHYROXINE SODIUM 75 MCG PO TABS: ORAL_TABLET | ORAL | Status: DC

## 2013-06-12 MED ORDER — AMIODARONE HCL 200 MG PO TABS: 100.0000 mg | ORAL_TABLET | Freq: Every day | ORAL | Status: DC

## 2013-06-12 MED ORDER — METOPROLOL SUCCINATE ER 25 MG PO TB24: 25.0000 mg | ORAL_TABLET | Freq: Every day | ORAL | Status: DC

## 2013-06-21 ENCOUNTER — Ambulatory Visit: Payer: Medicare Other | Admitting: Interventional Cardiology

## 2013-06-21 ENCOUNTER — Other Ambulatory Visit: Payer: Medicare Other

## 2013-07-07 ENCOUNTER — Encounter: Payer: Self-pay | Admitting: Interventional Cardiology

## 2013-07-12 ENCOUNTER — Other Ambulatory Visit: Payer: Self-pay | Admitting: Neurology

## 2013-07-26 ENCOUNTER — Other Ambulatory Visit: Payer: Self-pay | Admitting: Neurology

## 2013-07-27 ENCOUNTER — Other Ambulatory Visit (INDEPENDENT_AMBULATORY_CARE_PROVIDER_SITE_OTHER): Payer: Medicare Other

## 2013-07-27 ENCOUNTER — Encounter: Payer: Self-pay | Admitting: General Surgery

## 2013-07-27 ENCOUNTER — Encounter: Payer: Self-pay | Admitting: Interventional Cardiology

## 2013-07-27 ENCOUNTER — Ambulatory Visit (INDEPENDENT_AMBULATORY_CARE_PROVIDER_SITE_OTHER): Payer: Medicare Other | Admitting: Interventional Cardiology

## 2013-07-27 VITALS — BP 92/60 | HR 98 | Ht 66.0 in | Wt 153.1 lb

## 2013-07-27 DIAGNOSIS — Z953 Presence of xenogenic heart valve: Secondary | ICD-10-CM

## 2013-07-27 DIAGNOSIS — Z79899 Other long term (current) drug therapy: Secondary | ICD-10-CM

## 2013-07-27 DIAGNOSIS — Z952 Presence of prosthetic heart valve: Secondary | ICD-10-CM

## 2013-07-27 DIAGNOSIS — Z7901 Long term (current) use of anticoagulants: Secondary | ICD-10-CM

## 2013-07-27 DIAGNOSIS — J90 Pleural effusion, not elsewhere classified: Secondary | ICD-10-CM

## 2013-07-27 DIAGNOSIS — I480 Paroxysmal atrial fibrillation: Secondary | ICD-10-CM | POA: Insufficient documentation

## 2013-07-27 DIAGNOSIS — I5032 Chronic diastolic (congestive) heart failure: Secondary | ICD-10-CM

## 2013-07-27 DIAGNOSIS — Z5181 Encounter for therapeutic drug level monitoring: Secondary | ICD-10-CM

## 2013-07-27 DIAGNOSIS — I4891 Unspecified atrial fibrillation: Secondary | ICD-10-CM

## 2013-07-27 LAB — HEPATIC FUNCTION PANEL
ALBUMIN: 3.7 g/dL (ref 3.5–5.2)
ALK PHOS: 178 U/L — AB (ref 39–117)
ALT: 13 U/L (ref 0–53)
AST: 20 U/L (ref 0–37)
Bilirubin, Direct: 0.2 mg/dL (ref 0.0–0.3)
TOTAL PROTEIN: 6.9 g/dL (ref 6.0–8.3)
Total Bilirubin: 0.7 mg/dL (ref 0.3–1.2)

## 2013-07-27 LAB — TSH: TSH: 10.46 u[IU]/mL — ABNORMAL HIGH (ref 0.35–5.50)

## 2013-07-27 NOTE — Patient Instructions (Signed)
Your physician recommends that you continue on your current medications as directed. Please refer to the Current Medication list given to you today.   Your physician recommends that you schedule a follow-up appointment as needed  

## 2013-07-27 NOTE — Progress Notes (Signed)
Patient ID: [name], male   DOB: [birth year], 80 y.o.   MRN: [id number]    [address], [address], [place]  [address] Phone: [phone] Fax:  [phone]  Date:  07/27/2013   ID:  [name], DOB 1927-07-08, MRN [id number]  PCP:  Horton Finer, MD   ASSESSMENT:  1. Paroxysmal atrial fibrillation, suppressed with low-dose amiodarone, in the past. Current heart rate of near 100 suggests that he is in an atrial arrhythmia again. 2. Bioprosthetic aortic valve 3. Chronic anticoagulation therapy with Pradaxa 4. Chronic amiodarone therapy 5. Recent lower extremity swelling likely related to diastolic heart failure decompensation due to recurrence of atrial fibrillation. Edema in the extremities is being controlled with diuretic therapy.  PLAN:  1. No changes are required 2. Currently on hospice 3. Using Lasix and metolazone to control volume overload and do to diastolic heart failure.   SUBJECTIVE: [name] is a 80 y.o. male who has known history of PAF. He has weakness and fatigue. His metastatic cancer. He is on hospice. Recently he has had lower extremity swelling that ultimately as required metolazone and furosemide to control. Today he is in the office without cardiopulmonary complaints other than dyspnea. Lower extremity swelling is improved on the current diuretic regimen. He is having no chest pain. No bleeding on per [facility]   Wt Readings from Last 3 Encounters:  07/27/13 153 lb 1.9 oz (69.455 kg)  01/13/13 170 lb 3.2 oz (77.202 kg)  01/13/13 170 lb 3.2 oz (77.202 kg)     Past Medical History  Diagnosis Date  . Hypothyroidism     takes Levothyroxine  . Coronary artery disease     valve replaced  . Sleep apnea 2011    does not use CPAP  . Seizures     cannot remember last seizure  . Arthritis     all over body  . Anxiety     comes and goes  . Depression     comes and goes  . Dysrhythmia     recurrent arrhythmia w/AV replacement   . CHF (congestive heart failure) 02/10/11 note    has compensated CHF per Dr. Tamala Julian note  . Constipation 08/2009    after surgery  . Urinary tract bacterial infections 08/2010    from surgery and catheters    Current Outpatient Prescriptions  Medication Sig Dispense Refill  . amiodarone (PACERONE) 200 MG tablet Take 0.5 tablets (100 mg total) by mouth daily.  45 tablet  2  . atorvastatin (LIPITOR) 10 MG tablet Take 10 mg by mouth every morning.      . calcium-vitamin D (OSCAL WITH D) 500-200 MG-UNIT per tablet Take 1 tablet by mouth daily.      . clonazePAM (KLONOPIN) 0.5 MG tablet Take 0.5 mg by mouth daily.       . dabigatran (PRADAXA) 150 MG CAPS Take 150 mg by mouth every 12 (twelve) hours.       . furosemide (LASIX) 40 MG tablet       . levothyroxine (SYNTHROID, LEVOTHROID) 50 MCG tablet Take 50 mcg by mouth every morning.       Marland Kitchen levothyroxine (SYNTHROID, LEVOTHROID) 75 MCG tablet TAKE 1 TABLET EVERY MORNING ON EMPTY STOMACH  90 tablet  2  . metoprolol succinate (TOPROL-XL) 25 MG 24 hr tablet Take 1 tablet (25 mg total) by mouth daily.  90 tablet  2  . Misc Natural Products (OSTEO BI-FLEX ADV DOUBLE ST PO)  Take 1 tablet by mouth daily.       Marland Kitchen oxyCODONE-acetaminophen (PERCOCET/ROXICET) 5-325 MG per tablet Take 1-2 tablets by mouth every 4 (four) hours as needed.  40 tablet  0  . oxyCODONE-acetaminophen (ROXICET) 5-325 MG per tablet Take 1 tablet by mouth every 4 (four) hours as needed for pain.  30 tablet  0  . phenytoin (DILANTIN) 100 MG ER capsule TAKE 2 CAPSULES BY MOUTH TWICE DAILY *NEED OFFICE VISIT*  30 capsule  0  . polyethylene glycol (MIRALAX / GLYCOLAX) packet Take 8.5 g by mouth daily.       Marland Kitchen rOPINIRole (REQUIP) 0.5 MG tablet Take 0.5 mg by mouth every 8 (eight) hours.       . sertraline (ZOLOFT) 50 MG tablet Take 100 mg by mouth 2 (two) times daily.      . Tetrahydrozoline HCl (VISINE OP) Place 1 drop into both eyes daily.      . vitamin C (ASCORBIC ACID) 500 MG tablet  Take 500 mg by mouth daily.       No current facility-administered medications for this visit.    Allergies:    Allergies  Allergen Reactions  . Penicillins Other (See Comments)    Pt doesn't remember the reaction  . Warfarin And Related Other (See Comments)    Pt gets the shakes    Social History:  The patient  reports that he quit smoking about 29 years ago. His smoking use included Cigarettes. He has a 30 pack-year smoking history. He does not have any smokeless tobacco history on file. He reports that he does not drink alcohol or use illicit drugs.   ROS:  Please see the history of present illness.   Frail, decreased appetite, weakness, fatigue, and weight loss   All other systems reviewed and negative.   OBJECTIVE: VS:  BP 92/60  Pulse 98  Ht 5\' 6"  (1.676 m)  Wt 153 lb 1.9 oz (69.455 kg)  BMI 24.73 kg/m2 Well nourished, well developed, in no acute distress, elderly and chronically ill HEENT: normal Neck: JVD mildly elevated. Carotid bruit absent  Cardiac:  normal S1, S2; RRR; no murmur Lungs:  clear to auscultation bilaterally, no wheezing, rhonchi or rales Abd: soft, nontender, no hepatomegaly Ext: Edema 2+ bilateral in the ankles. Pulses trace to 1+ bilateral Skin: warm and dry Neuro:  CNs 2-12 intact, no focal abnormalities noted  EKG:  Not repeated       Signed, Illene Labrador III, MD 07/27/2013 11:33 AM

## 2013-08-08 ENCOUNTER — Other Ambulatory Visit: Payer: Self-pay | Admitting: Neurology

## 2013-08-09 ENCOUNTER — Other Ambulatory Visit: Payer: Self-pay | Admitting: Neurology

## 2013-08-09 ENCOUNTER — Telehealth: Payer: Self-pay | Admitting: *Deleted

## 2013-08-09 MED ORDER — PHENYTOIN SODIUM EXTENDED 100 MG PO CAPS: 200.0000 mg | ORAL_CAPSULE | Freq: Two times a day (BID) | ORAL | Status: DC

## 2013-08-09 NOTE — Telephone Encounter (Signed)
Called and left VM message for return call for more information concerning the last physician to prescribe medication since patient was not seen since 06/2012([name])

## 2013-08-09 NOTE — Telephone Encounter (Signed)
The patient is on hospice apparently, but the patient was just seen in the office by Dr. Pernell Dupre, apparently able to get to the office to see his cardiologist. I will call in another prescription for his Dilantin.

## 2013-08-09 NOTE — Addendum Note (Signed)
Addended by: Margette Fast on: 08/09/2013 05:35 PM   Modules accepted: Orders

## 2013-08-09 NOTE — Telephone Encounter (Signed)
[name] was trying to refill phenytoin (DILANTIN) 100 MG ER capsule, but was told pt needed to come in to see dr.  She wanted to let you know pt is under Hospice and Pallative care and unable to come to appt.  Please call and advise

## 2013-08-21 ENCOUNTER — Ambulatory Visit
Admission: RE | Admit: 2013-08-21 | Discharge: 2013-08-21 | Disposition: A | Discharge status: Home / Self Care | Payer: Medicare Other | Source: Ambulatory Visit | Attending: Internal Medicine | Admitting: Internal Medicine

## 2013-08-21 ENCOUNTER — Other Ambulatory Visit: Payer: Self-pay | Admitting: Internal Medicine

## 2013-08-21 DIAGNOSIS — M79601 Pain in right arm: Secondary | ICD-10-CM

## 2013-08-21 DIAGNOSIS — J90 Pleural effusion, not elsewhere classified: Secondary | ICD-10-CM

## 2013-08-21 DIAGNOSIS — M542 Cervicalgia: Secondary | ICD-10-CM

## 2013-08-23 ENCOUNTER — Ambulatory Visit
Admission: RE | Admit: 2013-08-23 | Discharge: 2013-08-23 | Disposition: A | Discharge status: Home / Self Care | Payer: Medicare Other | Source: Ambulatory Visit | Attending: Internal Medicine | Admitting: Internal Medicine

## 2013-08-23 DIAGNOSIS — M542 Cervicalgia: Secondary | ICD-10-CM

## 2013-08-23 DIAGNOSIS — M79601 Pain in right arm: Secondary | ICD-10-CM

## 2013-09-20 ENCOUNTER — Telehealth: Payer: Self-pay | Admitting: *Deleted

## 2013-09-20 NOTE — Telephone Encounter (Signed)
75 micrograms is ok to fill. Take 50 micrograms off list

## 2013-09-20 NOTE — Telephone Encounter (Signed)
Pharmacy is requesting refill on patients levothyroxine, there are 2 doses on med record, I'm unable to see when change was made or which is correct dose, please clarify  75 mcg or 50 mcg  Thanks, Lovett Sox, RN     [facility], [address]

## 2013-09-21 MED ORDER — LEVOTHYROXINE SODIUM 75 MCG PO TABS: ORAL_TABLET | ORAL | Status: AC

## 2013-09-21 NOTE — Telephone Encounter (Signed)
Rx filled

## 2013-10-08 ENCOUNTER — Other Ambulatory Visit: Payer: Self-pay | Admitting: Neurology

## 2013-10-22 ENCOUNTER — Other Ambulatory Visit: Payer: Self-pay | Admitting: Neurology

## 2013-10-22 NOTE — Telephone Encounter (Signed)
I spoke with the patient who says he will check his calendar and call back to schedule an appt.

## 2013-11-19 ENCOUNTER — Other Ambulatory Visit: Payer: Self-pay | Admitting: Neurology

## 2013-12-06 ENCOUNTER — Telehealth: Payer: Self-pay | Admitting: Neurology

## 2013-12-06 MED ORDER — PHENYTOIN SODIUM EXTENDED 100 MG PO CAPS: 200.0000 mg | ORAL_CAPSULE | Freq: Two times a day (BID) | ORAL | Status: DC

## 2013-12-06 NOTE — Telephone Encounter (Signed)
The patient is requesting a refill on Dilantin.  He was last seen 06/22/2012.  Patient is still under Hospice care.  The nurse would like to know if we will continue filling this Rx without an OV.  Please advise.  Thank you.

## 2013-12-06 NOTE — Telephone Encounter (Signed)
Carol from [facility] and Palliative care calling to state that patient needs a refill of Dilantin but was told that he needed to make an appointment first, but Arbie Cookey states patient is currently in hospice care and it is difficult to get him to appointments. Please return call to patient and advise.

## 2013-12-06 NOTE — Telephone Encounter (Signed)
I will refill the Dilantin.

## 2013-12-12 ENCOUNTER — Ambulatory Visit
Admission: RE | Admit: 2013-12-12 | Discharge: 2013-12-12 | Disposition: A | Discharge status: Home / Self Care | Payer: Medicare Other | Source: Ambulatory Visit | Attending: Internal Medicine | Admitting: Internal Medicine

## 2013-12-12 ENCOUNTER — Other Ambulatory Visit: Payer: Self-pay | Admitting: Internal Medicine

## 2013-12-12 DIAGNOSIS — C3490 Malignant neoplasm of unspecified part of unspecified bronchus or lung: Secondary | ICD-10-CM

## 2014-02-19 ENCOUNTER — Other Ambulatory Visit: Payer: Self-pay | Admitting: Interventional Cardiology

## 2014-03-24 ENCOUNTER — Other Ambulatory Visit: Payer: Self-pay | Admitting: Neurology

## 2014-03-25 NOTE — Telephone Encounter (Signed)
Patient is under Hospice care, unable to come in for appt at this time.

## 2014-04-21 ENCOUNTER — Other Ambulatory Visit: Payer: Self-pay | Admitting: Neurology

## 2014-04-22 NOTE — Telephone Encounter (Signed)
The patient is requesting a refill on Dilantin. He was last seen 06/22/2012. Patient is still under Hospice care. The nurse would like to know if we will continue filling this Rx without an OV. Please advise. Thank you.

## 2014-05-02 ENCOUNTER — Other Ambulatory Visit: Payer: Self-pay | Admitting: Interventional Cardiology

## 2014-05-20 DEATH — deceased

## 2014-10-06 IMAGING — CR DG CHEST 1V PORT
1 series · 1 of 1 positions shown · non-contrast
Comparison: 01/03/2013

CLINICAL DATA: Shortness of breath

EXAM:
PORTABLE CHEST - 1 VIEW

[AP]
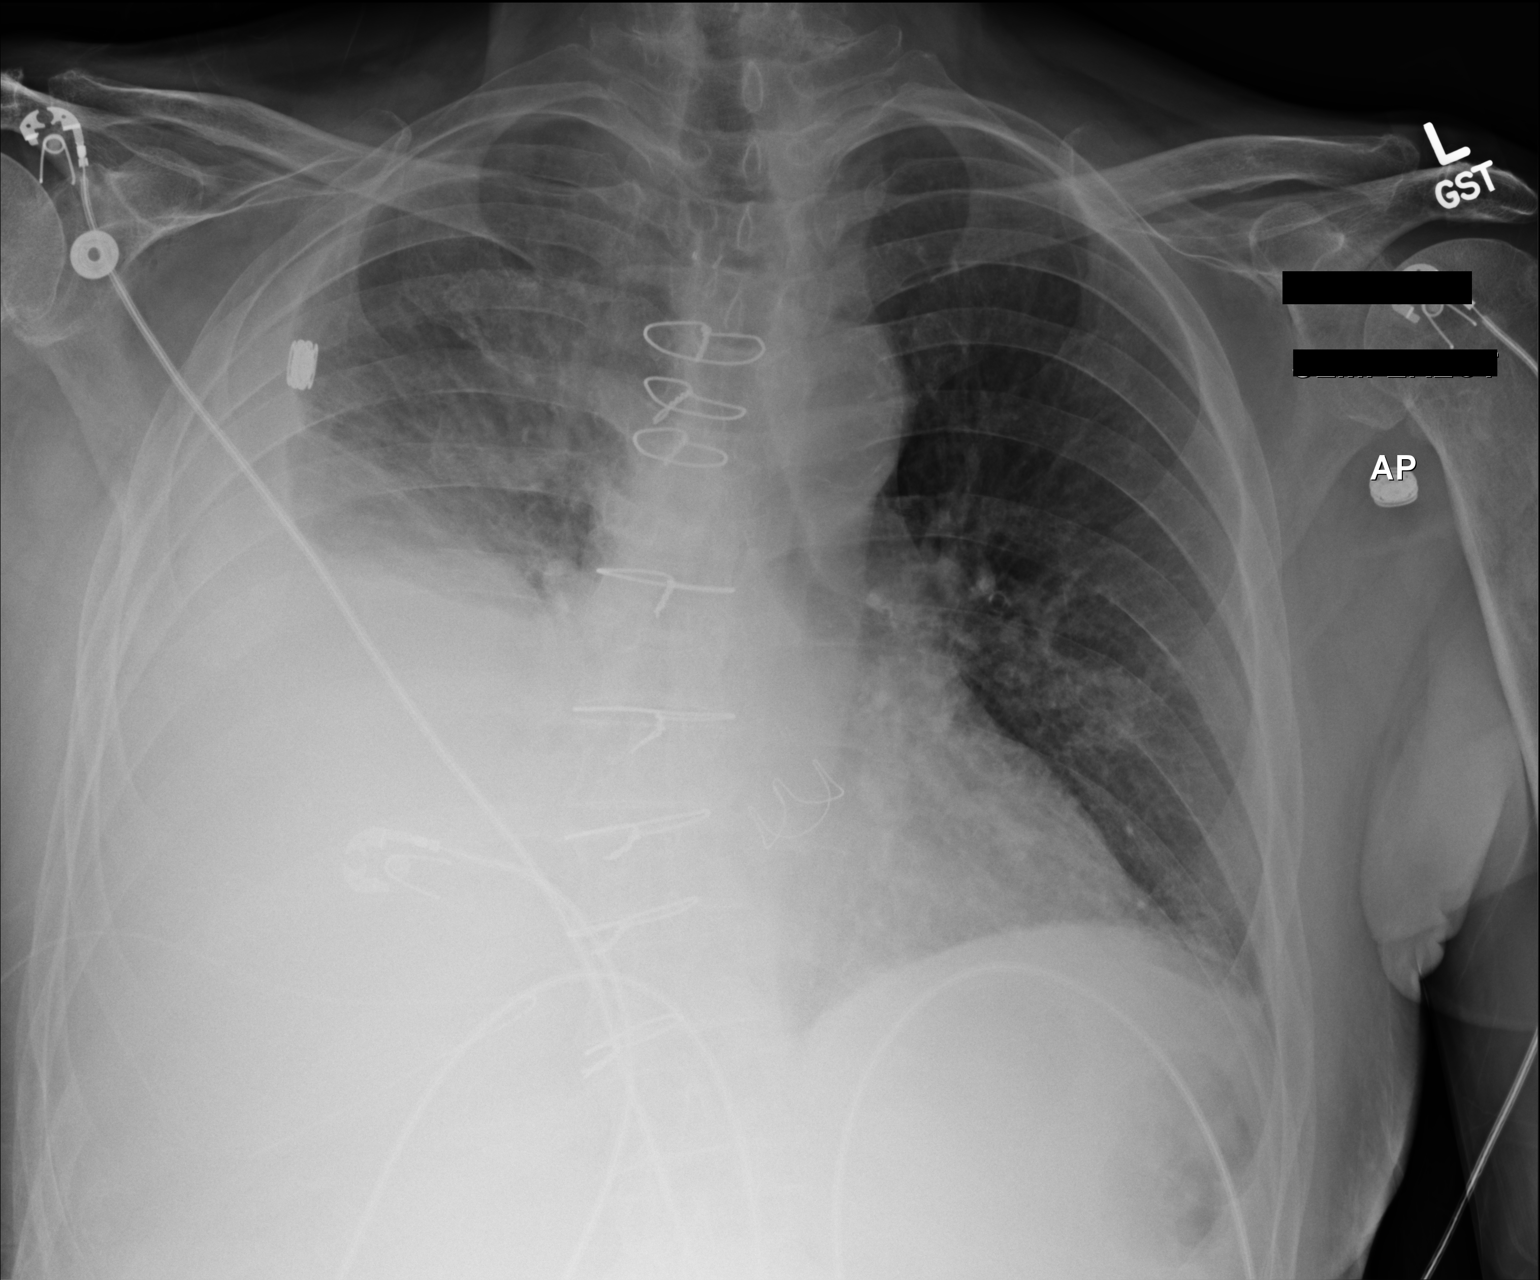

[1 of 1 positions shown; findings below may reference images not displayed]

FINDINGS: Stable heart size. Status post aortic valve replacement.

Enlarging right pleural effusion, now large in volume, with
extensive underlying passive atelectasis. Patchy interstitial
opacities at the left base are new from prior. No overt edema based
on the left upper lobe vascularity. No pneumothorax.

Unchanged positioning of a right base pleural catheter.
IMPRESSION: 1. Increased right pleural effusion, now large volume. A right
pleural catheter is in stable position.
2. Small new opacity in the left lower lung. If there is clinical
concern for infection, this could represent an early infectious
infiltrate.

## 2014-10-07 IMAGING — CR DG CHEST 2V
2 series · 2 of 2 positions shown · non-contrast
Comparison: January 07, 2013

CLINICAL DATA: For fusion

EXAM:
CHEST  2 VIEW

[w chest pa]
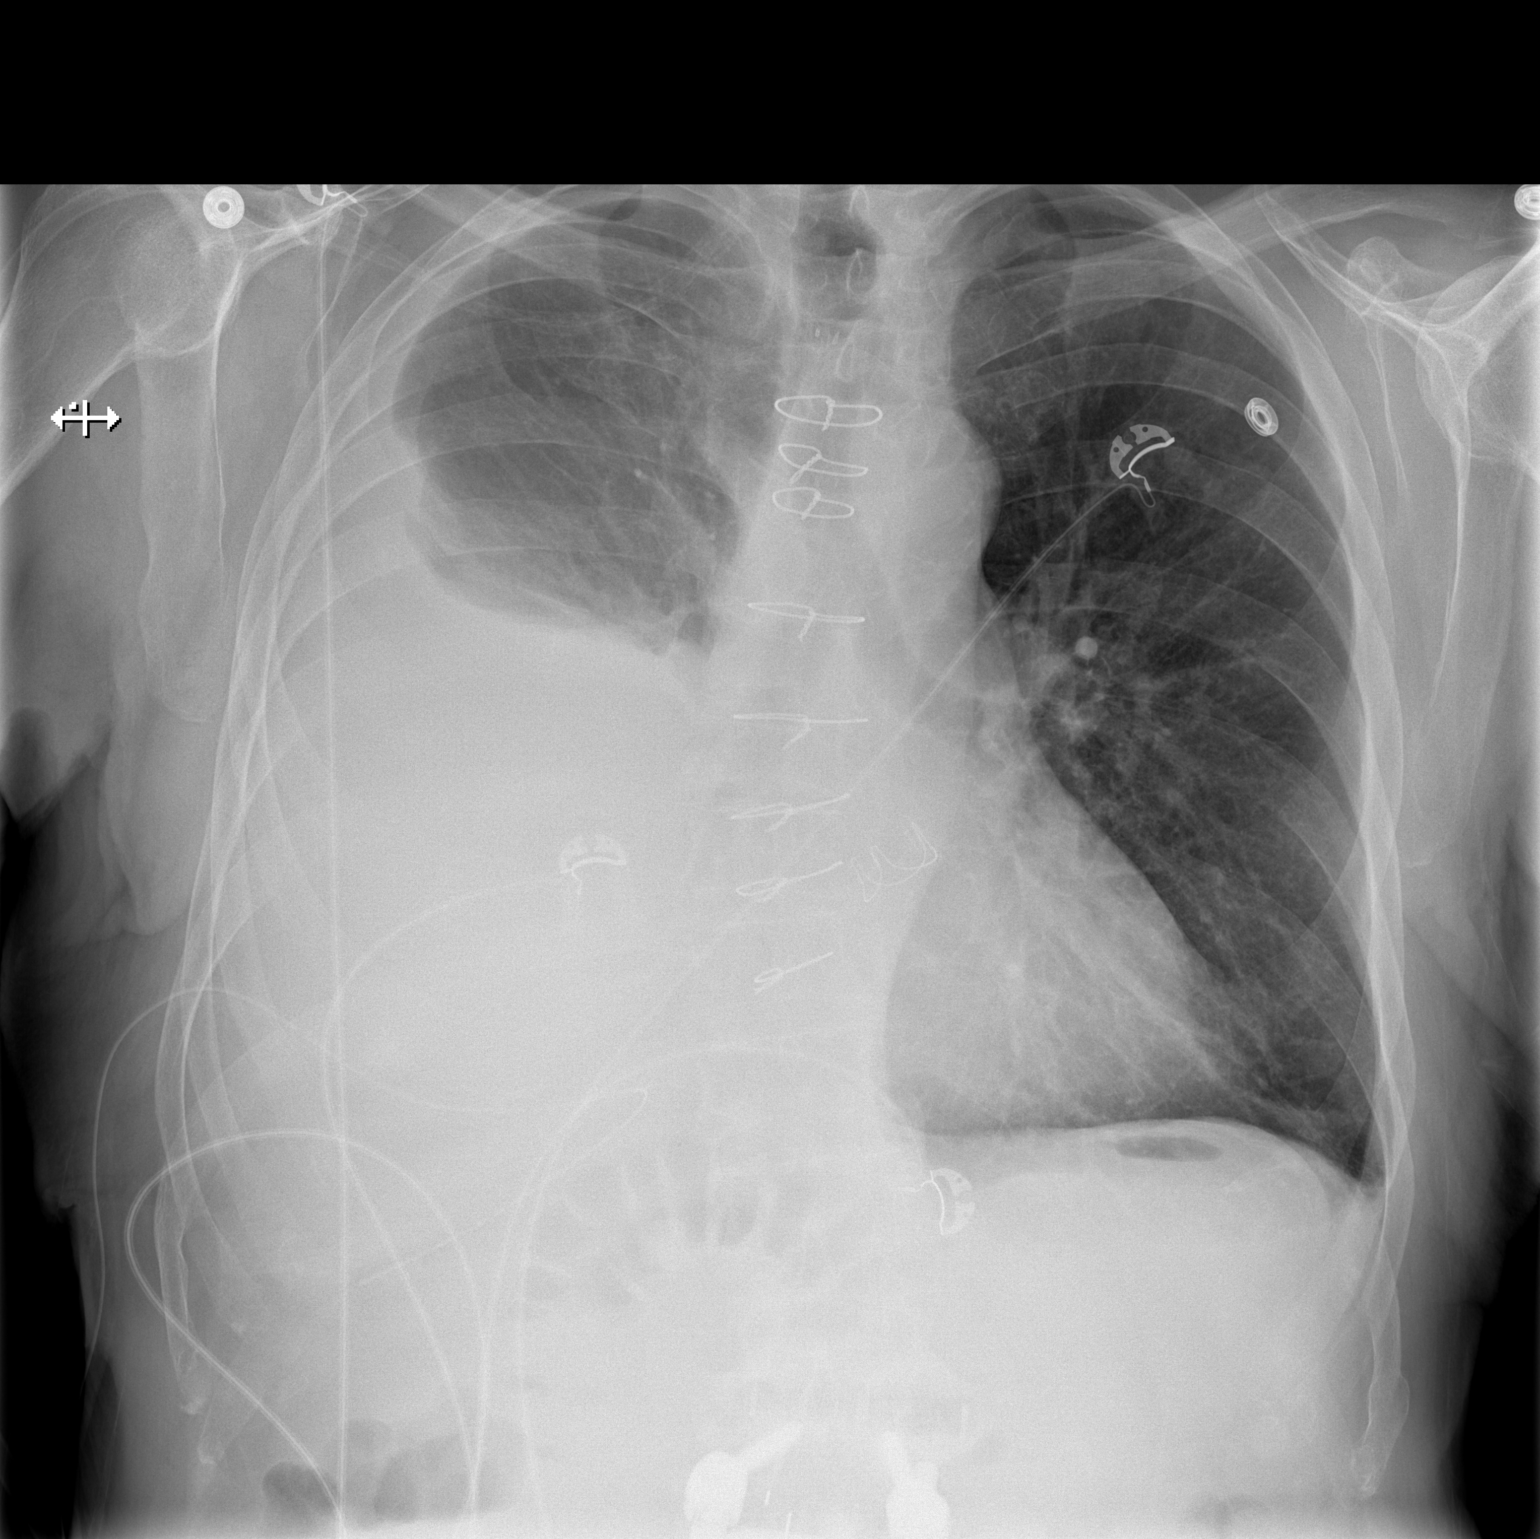

[w chest lat]
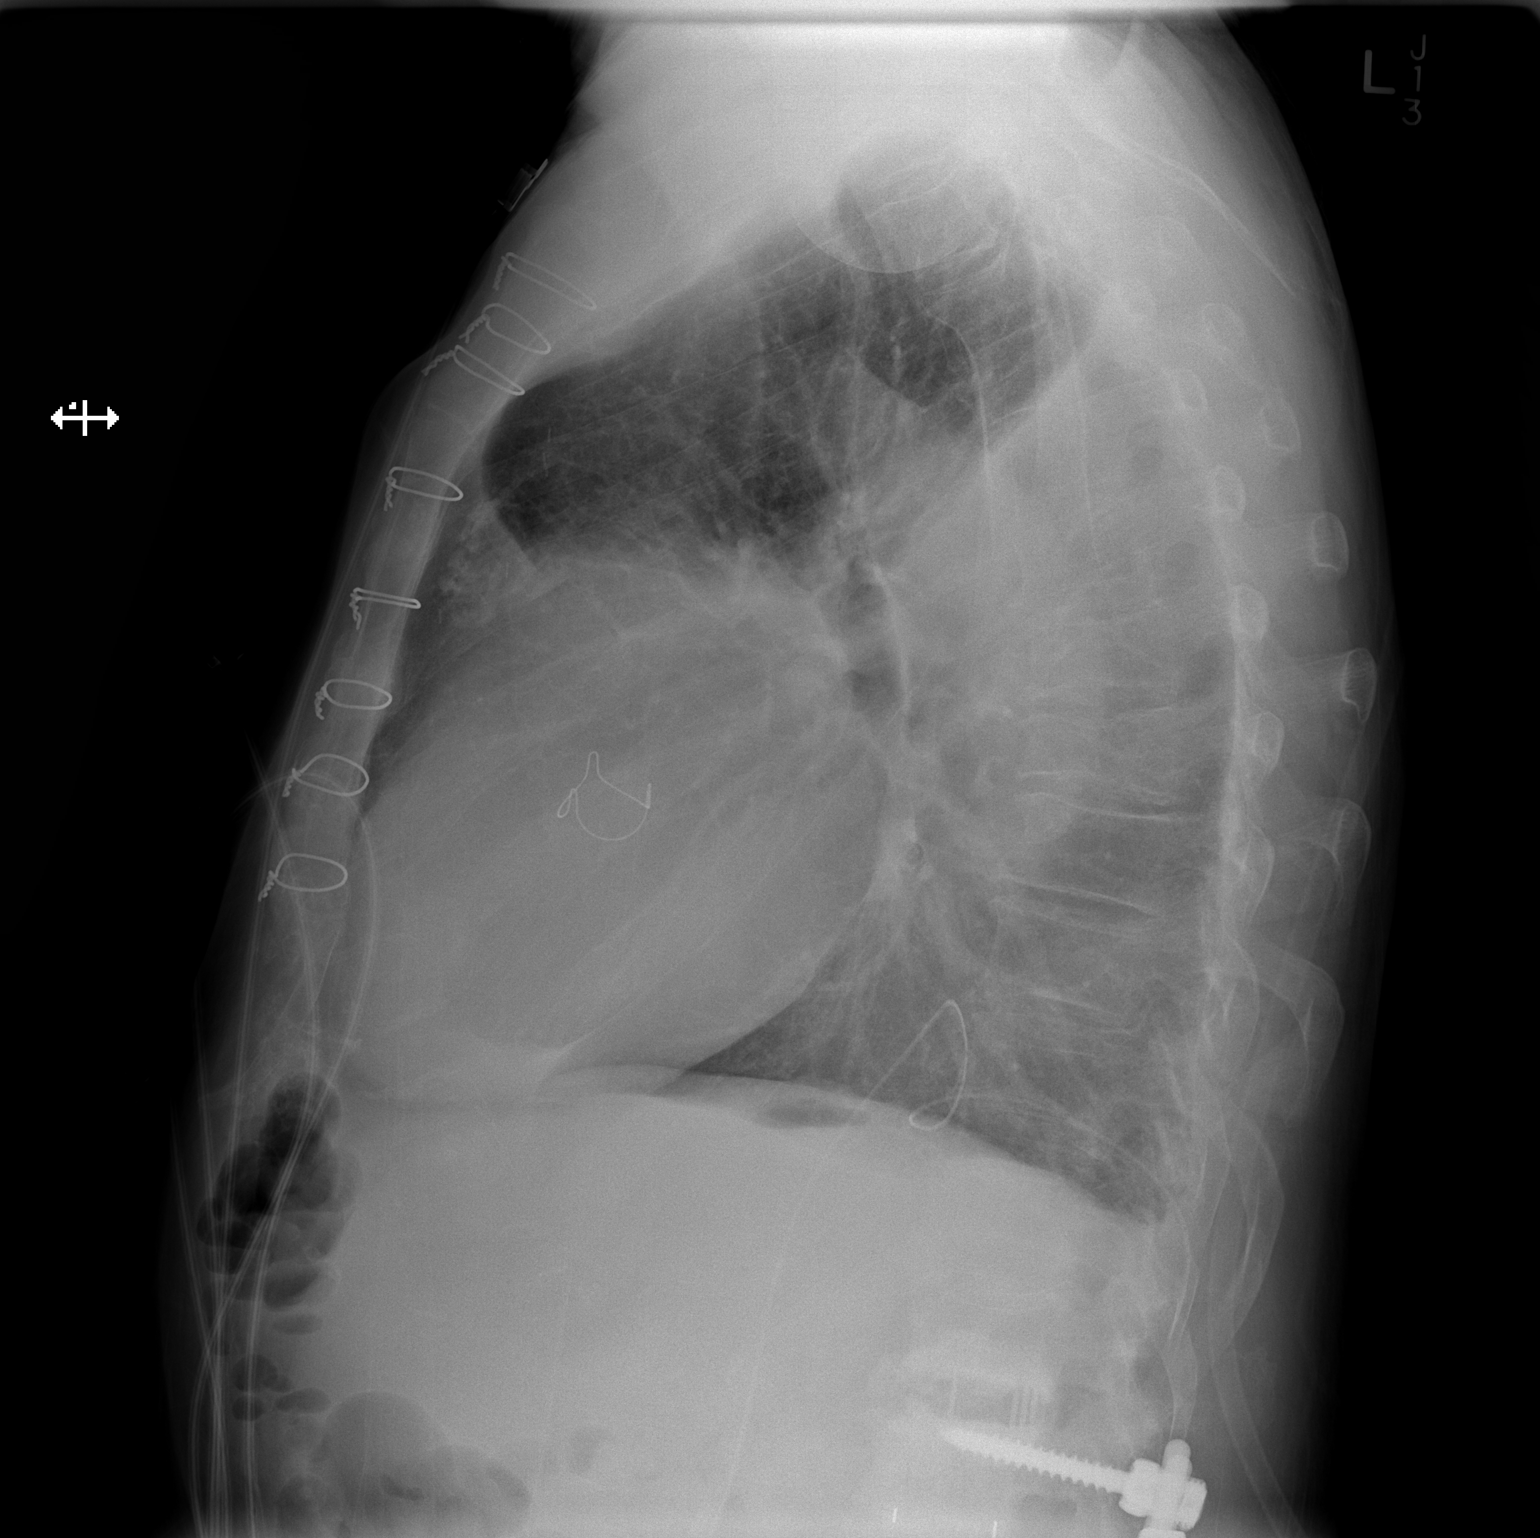

[2 of 2 positions shown; findings below may reference images not displayed]

FINDINGS: There is a large right pleural effusion with consolidation of right
mid and lung base unchanged. The left lung is clear. The mediastinal
contour and cardiac silhouette are stable. Cardiac valvular
replacement ring is unchanged. The patient is status post fixation
of lumbar spine. Scoliosis of spine is noted.
IMPRESSION: Persistent large right pleural effusion with consolidation of right
mid and lung base.

## 2014-10-08 IMAGING — DX DG CHEST 1V PORT
1 series · 1 of 1 positions shown · non-contrast
Comparison: 01/08/2013

CLINICAL DATA: Post VATS, line placement

EXAM:
PORTABLE CHEST - 1 VIEW

[portable]
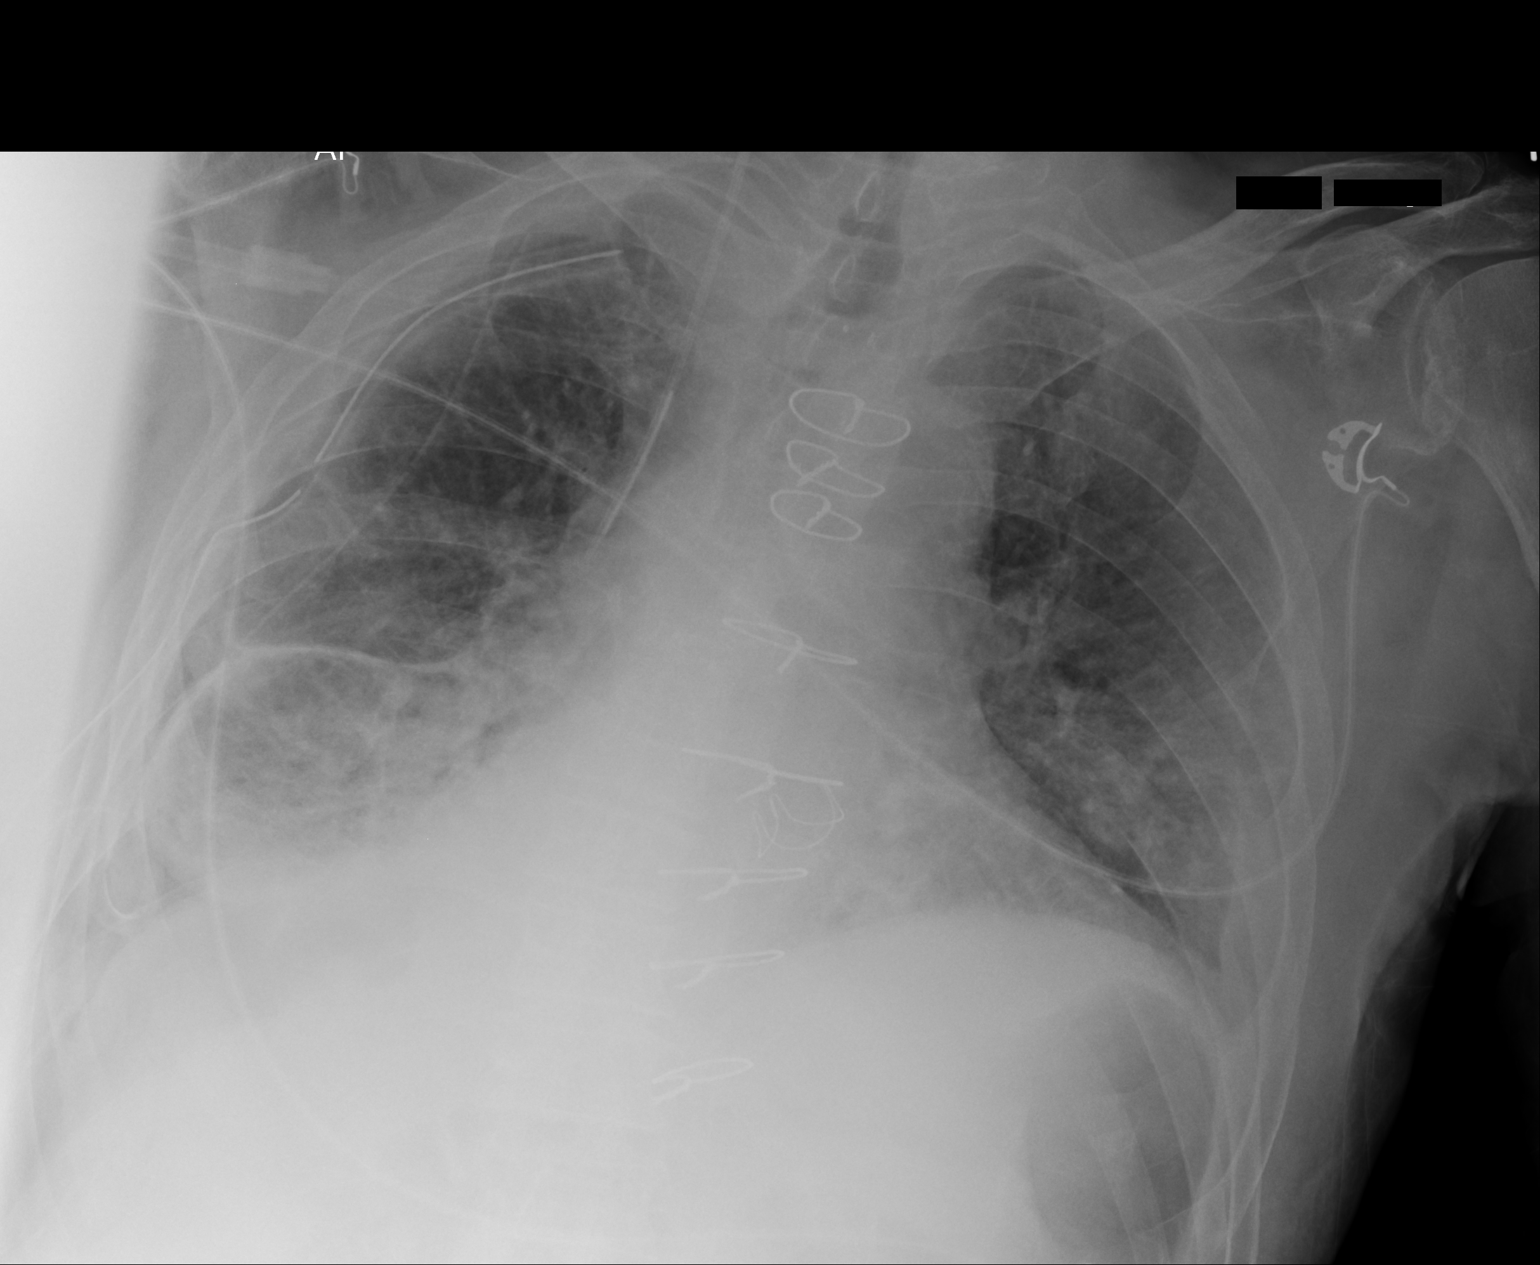

[1 of 1 positions shown; findings below may reference images not displayed]

FINDINGS: Again noted is status post median sternotomy and cardiac valve
replacement. Central mild vascular congestion and mild perihilar
interstitial prominence suspicious for mild interstitial edema.
Previous right pleural effusion has resolved. There is a right chest
tube with tip in right upper hemithorax. Small right lower lateral
pneumothorax. There is thickening of right lower lobe pleura and
mild thickening of right minor fissure. Residual atelectasis or
infiltrate is noted in right lower lobe and right middle lobe. Right
IJ central line with tip in SVC right atrium junction.
IMPRESSION: Central mild vascular congestion and mild perihilar interstitial
prominence suspicious for mild interstitial edema. Previous right
pleural effusion has resolved. There is a right chest tube with tip
in right upper hemithorax. Small right lower lateral pneumothorax.
There is thickening of right lower lobe pleura and mild thickening
of right minor fissure. Residual atelectasis or infiltrate is noted
in right lower lobe and right middle lobe. Right IJ central line
with tip in SVC right atrium junction. .

## 2014-10-09 IMAGING — CR DG CHEST 1V PORT
1 series · 1 of 1 positions shown · non-contrast
Comparison: Portable chest x-ray of 01/09/2013

CLINICAL DATA: Post VAT S on the right, follow-up

EXAM:
PORTABLE CHEST - 1 VIEW

[AP]
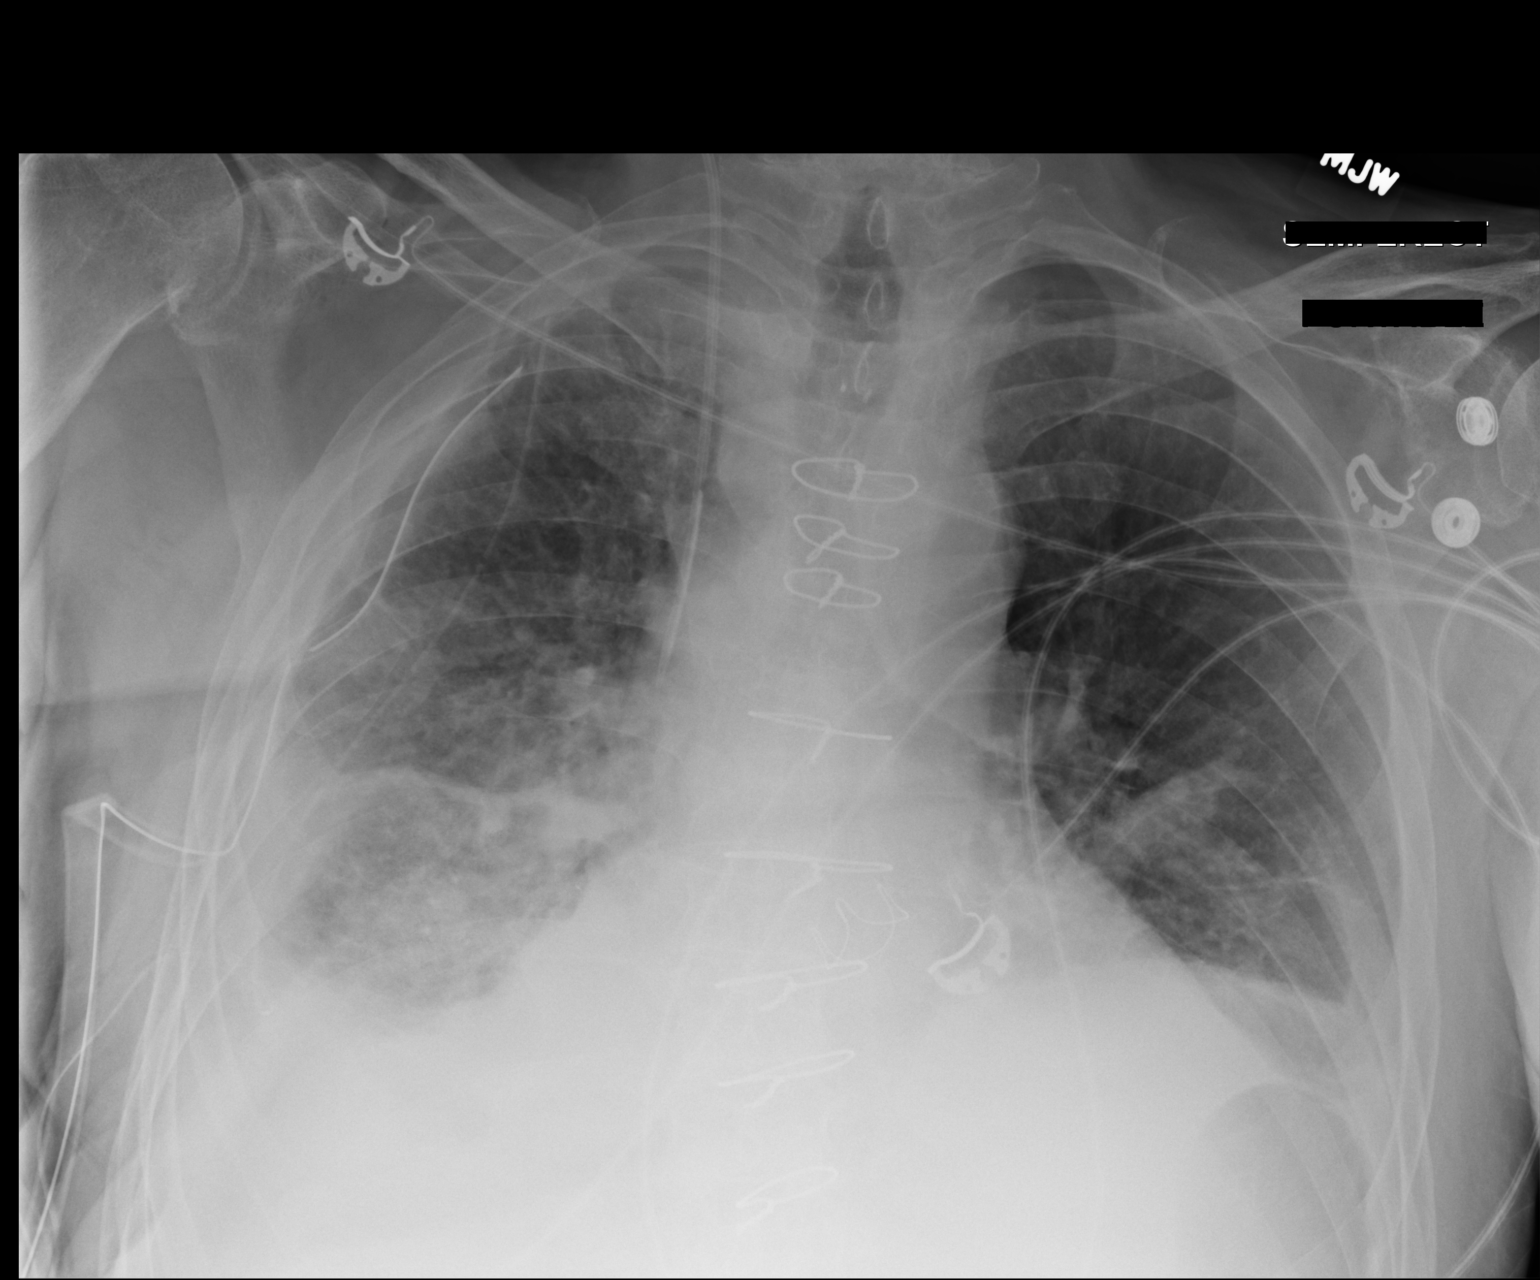

[1 of 1 positions shown; findings below may reference images not displayed]

FINDINGS: There is little change in poor aeration with basilar atelectasis and
effusions right greater than left. A right chest tube is unchanged
and right IJ central venous line tip overlies the lower SVC.
Cardiomegaly is stable and there may be pulmonary vascular
congestion present.
IMPRESSION: No significant change in basilar atelectasis and effusions with
probable congestion.

## 2014-10-14 ENCOUNTER — Other Ambulatory Visit: Payer: Self-pay
